# Patient Record
Sex: Female | Born: 1935 | Race: White | Hispanic: No | Marital: Married | Smoking: Never smoker
Health system: Southern US, Community
[De-identification: ages and names within clinical notes are randomized; demographics above are authoritative.]

## PROBLEM LIST (undated history)

## (undated) DIAGNOSIS — E785 Hyperlipidemia, unspecified: Secondary | ICD-10-CM

## (undated) DIAGNOSIS — I10 Essential (primary) hypertension: Secondary | ICD-10-CM

## (undated) DIAGNOSIS — I77810 Thoracic aortic ectasia: Secondary | ICD-10-CM

## (undated) DIAGNOSIS — E119 Type 2 diabetes mellitus without complications: Secondary | ICD-10-CM

## (undated) DIAGNOSIS — J189 Pneumonia, unspecified organism: Secondary | ICD-10-CM

## (undated) DIAGNOSIS — I350 Nonrheumatic aortic (valve) stenosis: Secondary | ICD-10-CM

## (undated) HISTORY — PX: CHOLECYSTECTOMY: SHX55

## (undated) HISTORY — DX: Hyperlipidemia, unspecified: E78.5

## (undated) HISTORY — PX: APPENDECTOMY: SHX54

## (undated) HISTORY — PX: TONSILLECTOMY: SUR1361

## (undated) HISTORY — PX: CATARACT EXTRACTION W/ INTRAOCULAR LENS  IMPLANT, BILATERAL: SHX1307

## (undated) HISTORY — DX: Essential (primary) hypertension: I10

## (undated) HISTORY — DX: Thoracic aortic ectasia: I77.810

## (undated) HISTORY — DX: Type 2 diabetes mellitus without complications: E11.9

## (undated) HISTORY — PX: KNEE ARTHROPLASTY: SHX992

## (undated) HISTORY — DX: Nonrheumatic aortic (valve) stenosis: I35.0

---

## 1999-02-10 ENCOUNTER — Encounter: Payer: Self-pay | Admitting: Cardiothoracic Surgery

## 1999-02-12 ENCOUNTER — Encounter (INDEPENDENT_AMBULATORY_CARE_PROVIDER_SITE_OTHER): Payer: Self-pay | Admitting: Specialist

## 1999-02-13 ENCOUNTER — Inpatient Hospital Stay (HOSPITAL_COMMUNITY): Admission: AD | Admit: 1999-02-13 | Discharge: 1999-02-20 | Payer: Self-pay | Admitting: Cardiology

## 1999-02-13 ENCOUNTER — Encounter: Payer: Self-pay | Admitting: Cardiothoracic Surgery

## 1999-02-13 HISTORY — PX: AORTIC VALVE REPLACEMENT: SHX41

## 1999-02-14 ENCOUNTER — Encounter: Payer: Self-pay | Admitting: Cardiothoracic Surgery

## 1999-02-16 ENCOUNTER — Encounter: Payer: Self-pay | Admitting: Cardiothoracic Surgery

## 2001-09-29 ENCOUNTER — Ambulatory Visit (HOSPITAL_COMMUNITY): Admission: RE | Admit: 2001-09-29 | Discharge: 2001-09-29 | Payer: Self-pay | Admitting: *Deleted

## 2004-03-05 ENCOUNTER — Encounter: Payer: Self-pay | Admitting: Orthopedic Surgery

## 2004-03-09 ENCOUNTER — Encounter: Payer: Self-pay | Admitting: Orthopedic Surgery

## 2004-04-09 ENCOUNTER — Encounter: Payer: Self-pay | Admitting: Orthopedic Surgery

## 2004-05-07 ENCOUNTER — Encounter: Payer: Self-pay | Admitting: Orthopedic Surgery

## 2006-05-31 ENCOUNTER — Ambulatory Visit: Payer: Self-pay | Admitting: Otolaryngology

## 2007-12-23 ENCOUNTER — Ambulatory Visit: Payer: Self-pay | Admitting: Cardiology

## 2007-12-23 ENCOUNTER — Encounter (INDEPENDENT_AMBULATORY_CARE_PROVIDER_SITE_OTHER): Payer: Self-pay | Admitting: Family Medicine

## 2007-12-23 ENCOUNTER — Inpatient Hospital Stay (HOSPITAL_COMMUNITY): Admission: EM | Admit: 2007-12-23 | Discharge: 2007-12-24 | Payer: Self-pay | Admitting: Emergency Medicine

## 2009-10-02 ENCOUNTER — Ambulatory Visit: Payer: Self-pay | Admitting: Cardiothoracic Surgery

## 2010-04-02 ENCOUNTER — Ambulatory Visit
Admission: RE | Admit: 2010-04-02 | Discharge: 2010-04-02 | Payer: Self-pay | Source: Home / Self Care | Attending: Cardiothoracic Surgery | Admitting: Cardiothoracic Surgery

## 2010-04-02 ENCOUNTER — Encounter
Admission: RE | Admit: 2010-04-02 | Discharge: 2010-04-02 | Payer: Self-pay | Source: Home / Self Care | Attending: Cardiothoracic Surgery | Admitting: Cardiothoracic Surgery

## 2010-04-03 NOTE — Assessment & Plan Note (Signed)
OFFICE VISIT  [name], [name] DOB:  Aug 09, 1926                                        April 02, 2010 CHART #:  [id number]  CURRENT PROBLEMS: 1. A 4.5-cm moderate dilatation of the ascending aorta - fusiform     aneurysm. 2. Status post aortic valve replacement in December 2000 with a     bioprosthetic (bovine) valve for aortic stenosis. 3. Hypertension.  CURRENT MEDICATIONS: 1. Nortriptyline 10 mg nightly. 2. Benazepril 5 mg daily. 3. Losartan/HCTZ 100/25 mg daily. 4. Aspirin 81 mg daily. 5. Prandin 0.5 mg p.o. b.i.d. 6. Metformin 1/2 tablet daily (500 mg).  PRESENT ILLNESS:  The patient returns for a 47-month followup with a CT scan of the chest to follow dilatation of the ascending aorta status post prior aortic valve replacement.  Her most recent echo done at [place] indicated evidence of a well-seated valve without aortic insufficiency with a peak gradient measured at 50 mmHg which is probably artifactual.  A 21-mm valve was placed for a bicuspid valve.  The patient denies any symptoms of CHF, orthopnea, angina, or significant change in her exercise tolerance.  She denies pedal or abdominal edema.  She lives alone and remains fairly active.  She is a TEFL teacher Witness and would not consent to any surgery that would require blood transfusion therapy.  PHYSICAL EXAMINATION:  Vital Signs:  Blood pressure 150/80, pulse 77 and regular, and saturation 95% on room air.  Weight 147 pounds.  General: She is alert and very pleasant.  Lungs:  Breath sounds are clear and equal.  The sternum is well healed.  Cardiac:  Rhythm is regular.  She has a soft flow murmur through the aortic prosthesis and there is no evidence of diastolic murmur.  Abdomen:  Soft.  Extremities:  She has no pedal edema.  DIAGNOSTIC TESTS:  His CT scan today as compared to the last CT scan 6 months ago at [facility] shows no change in raising aorta.  The  diameter measures approximately 4.3 cm by the CT scan here at [facility].  There is no penetrating ulcer or false lumen.  The descending thoracic aorta is of normal caliber.  IMPRESSION AND PLAN:  The patient has moderate dilatation of her ascending aorta probably related to her bicuspid aortic valve disease and the associated abnormalities in the aortic root, connective tissue structure.  Blood pressure control with a beta-blocker would be the best therapy for this elderly lady who would be at very high risk for redo heart surgery, especially under the circumstances of her wishing to avoid any blood products.  I would recommend continuing her medical therapy and I plan on following her with annual CT scan without contrast in order to avoid the dye load or any effect on her renal function.  She understands this plan and agrees with that and I will see her back in 1 year unless new problems occur.  Thank you very much for the opportunity to see this nice patient.  [name] [name], M.D. Electronically Signed  PV/MEDQ  D:  04/02/2010  T:  04/03/2010  Job:  [id number]  cc:   [name] Buffy, MD

## 2010-07-22 NOTE — Discharge Summary (Signed)
NAME:  [name], [name]                  ACCOUNT NO.:  [id number]   MEDICAL RECORD NO.:  [id number]          PATIENT TYPE:  INP   LOCATION:  A310                          FACILITY:  [facility]   PHYSICIAN:  Dorris Singh, DO    DATE OF BIRTH:  07/31/1926   DATE OF ADMISSION:  12/23/2007  DATE OF DISCHARGE:  10/17/2009LH                               DISCHARGE SUMMARY   ADMISSION DIAGNOSIS:  1. Chest pain.   DISCHARGE DIAGNOSIS:  1. Gastroesophageal reflux disease.  2. Chest pain resolved and history of coronary artery disease.  3. Renal insufficiency.  4. Diastolic dysfunction.   Tests that were done include a portable chest x-ray one view which  showed no acute abnormality.  Also she had an echo done on the 16th  which demonstrated left ventricular with overall ventricular function is  between 70-75%.  There is no diagnostic evidence of any ventricular wall  motion abnormalities.  Left ventricular wall thickness was moderately  increased which is consistent with mild diastolic dysfunction.  Ascending aortic dimension was 42 mm.  There is mild atrial annular  calcification.  There is mild mitral valvular regurgitation, the  effective  mitral regurgitation by proximal isovelocity of surface area  was .32 cm squared with a volume of mitral regurgitation  proximal  isovelocity was 26 cc.  Left atrium was mildly dilated and estimated  peak pulmonary artery systolic pressure was mildly improved.   HOSPITAL COURSE:  She was admitted with the above diagnosis.  [facility] was called to see her.  The patient actually has a  cardiologist in [place] with whom she has been following for several  years.  [place] [facility] read her echo and determined that if her  enzymes remained normal for today she could be discharged to home with  followup with her cardiologist in 1 to 2 weeks.   On evaluation today the patient was doing well.  She had no episodes of  chest pain.  Her vitals are  within normal limits with a blood pressure  of 109/59.  Heart is regular rate and rhythm.  Mild crepitations  bilaterally.  Abdomen soft, nontender, not distended.  Extremities  positive pulses.  Lungs clear auscultation bilaterally.   [place] count of 5.5, hemoglobin 1.7, hematocrit 34.0, platelet count of  150.  Her glucose was 140, sodium 139, potassium 3.8, chloride 99, C02  27, glucose 138, BUN 23, creatinine 1.52.   Cardiac markers for this morning:  Her creatinine kinase was 331.  CK-MB  was 3.2 and Troponin's were  0.02 and her BNP was 111 which is slightly  elevated.   PLAN AT DISCHARGE:  1. She will  be placed on Protonix.  A new prescription 1 p.o. q. day.      Also at this point in time with an elevated BNP we will have her      followup with her primary care doctor and/or her physician to check      this and to follow it to see if she will need to be placed on Lasix  but right now I think she can be followed and her cardiologist can      make that determination.  She will be sent home on her current      medications with include Atacand 4 mg p.o. daily, atenolol 50 mg      p.o. daily, Doxycycline 100 mg p.o. b.i.d., hydrocortisone 2.5%      twice daily, Metformin 500 mg once a day, Prandin 0.5 mg b.i.d.,      Tramadol hydrochlorothiazide 75/50 once a day, Tricor 48 mg once a      day, acetic acid otic one drop twice a day, Naftin topical 0.1      twice a day, __________ 0.2% one drop twice a day.   INSTRUCTIONS:  1. Increase her activities slowly.  2. Eat a heart healthy diet.  3. Followup with her primary care physician Dr. Isabella Bowens  in 1 to 2 weeks      and for her cardiologist as recommended within the next 5 to 7 days      or sooner if symptoms occur.  4. She is to return if she has any symptoms.   Patient seems to have an understanding of the current plan. <30 minutes  on dc summary      Dorris Singh, DO  Electronically Signed     CB/MEDQ  D:   12/24/2007  T:  12/24/2007  Job:  [id number]

## 2010-07-22 NOTE — H&P (Signed)
NAME:  [name] [name], [name] [name]                  ACCOUNT NO.:  [id number]   MEDICAL RECORD NO.:  [id number]          PATIENT TYPE:  INP   LOCATION:  A310                          FACILITY:  [facility]   PHYSICIAN:  Dorris Singh, DO    DATE OF BIRTH:  01-13-1927   DATE OF ADMISSION:  12/23/2007  DATE OF DISCHARGE:  LH                              HISTORY & PHYSICAL   Patient is a 77 year old female who presented with a chief complaint of  chest discomfort.  She apparently went to her primary care physician  today.  She is actually bringing her husband in for evaluation, when she  was telling the nurse that she was having some neck pain.  She describes  it as having it for about five days.  She also states that the onset was  acute and that she feels it more in her throat, like her throat feels  raw.  She states that it is located in the bilateral chest, particularly  in her left side.  Nothing made it better, nothing made it worse.   PAST MEDICAL HISTORY:  1. Coronary artery disease.  2. Hypertension.  3. Diabetes.  4. Rhinitis.  5. Rosacea.  6. Otitis externa.   FAMILY HISTORY:  Significant for CAD.  She has had a CABG.   She is a nonsmoker, nondrinker.   She has a LATEX allergy.   ALLERGIES:  SULFA, LATEX, as mentioned.   CURRENT MEDICATIONS:  1. Atacand 4 mg p.o. daily.  2. Atenolol 50 mg p.o. daily.  3. Doxycycline 100 mg twice daily.  4. Hydrocortisone 2.5 twice daily.  5. Metformin 500 mg once daily.  6. Prandin 0.5 mg b.i.d.  7. Tramadol/hydrochlorothiazide 75/50 once daily.  8. Tricor 48 mg once daily.  9. Acetic acid otic 1 drop twice daily.  10.Naftin topical 0.1% twice daily.  11.Pataday ophthalmic 0.2% 1 drop twice daily.   REVIEW OF SYSTEMS:  CONSTITUTIONAL:  Negative.  HEENT:  Negative.  HEART:  Positive chest pain.  PULMONARY:  Negative.  GI:  Possible  dyspepsia.  GU:  Negative.  MUSCULOSKELETAL:  Negative.   PHYSICAL EXAMINATION:  VITAL SIGNS:  Blood pressure  119/62, pulse rate  62, respirations 20, temperature 97.6.  GENERAL:  Patient is a 77 year old Caucasian female who is well-  developed and well-nourished in no acute distress.  HEAD:  Normocephalic and atraumatic.  EYES:  EOMI, PERRLA.  ENT:  Normal.  MOUTH/PHARYNX:  No erythema or exudate noted.  NECK:  Supple.  No lymphadenopathy.  Full range of motion.  CARDIOVASCULAR:  Regular rate and rhythm.  No murmurs, rubs or gallops.  CHEST:  Movements are symmetrical.  Positive CABG scar.  RESPIRATORY:  Clear to auscultation bilaterally.  No wheezes, rales or  rhonchi.  ABDOMEN:  Soft, nontender, nondistended.  No organomegaly noted.  Bowel  sounds in all four quadrants.  EXTREMITIES:  Full range of motion.  No tenderness.  No edema.  Positive  right surgical scar on the knee.  NEURO:  Cranial nerves II-XII are grossly intact.  Patient is AO x3.  Her EKG shows first-degree AV block.  She is in sinus rhythm.  There is  no EKG for comparison.   Her BNP is 110.  Sodium 137, potassium 3.7, chloride 101, carbon dioxide  29, glucose 76, BUN 27, creatinine 1.27.  [place] count 6.9, hemoglobin  12.6, hematocrit 36.6, platelet count 168.  First set of cardiac markers  is negative.  The second set of cardiac markers is negative.   Her chest x-ray shows no acute abnormality.   Patient was admitted for chest pain:  Will go ahead and admit her to the  telemetry floor and continue to monitor her.  Will put her on bedrest.  Also, will consult [facility] and see if we need to repeat the  echo today.  Patient is complaining of some dyspepsia symptoms as well.  We will do Protonix p.o. q.12h. and do DVT and GI prophylaxis as well.  Will continue to monitor her.  If everything is within normal limits, we  will plan on discharging her.      Dorris Singh, DO  Electronically Signed     CB/MEDQ  D:  12/23/2007  T:  12/23/2007  Job:  [id number]

## 2010-07-22 NOTE — Consult Note (Signed)
NEW PATIENT CONSULTATION   [name] [name], [name] [name]  DOB:  1927/01/22                                        October 02, 2009  CHART #:  [id number]   REASON FOR CONSULTATION:  1. A 4.7 cm moderate dilatation of the ascending aorta - fusiform      aneurysm.  2. Status post aortic valve replacement in December 2000 with a      bioprosthetic valve for aortic stenosis.  3. Hypertension.   HISTORY OF PRESENT ILLNESS:  I was asked to evaluate this 77 year old  Caucasian hypertensive female for treatment of recently diagnosed  fusiform aneurysm, measuring 4.7 cm of the ascending thoracic aorta.  The patient underwent an aortic valve replacement by myself at [facility], December 2010, for severe aortic stenosis with 21-mm  pericardial valve.  The patient has been carefully followed by Dr.  Kelton Pillar with annual 2-D echoes.  The aortic valve continues to function  appropriately with a mild gradient and without aortic insufficiency.  LV  systolic function is well preserved and LVH with diastolic dysfunction  is moderate.  The patient on her most recent echo was noted to have an  enlarged ascending aorta and a followup CT angiogram was performed at  [facility] in [place].  This demonstrates a fusiform  ascending aneurysm, measuring 4.7 cm with some mild dilatation of the  transverse aortic arch and a normal descending thoracic aortic diameter  of 2.5 cm.  There is no evidence of dissection, intramural hematoma, or  penetrating ulcer.  There are no suspicious pulmonary nodules or pleural  effusion noted.  The patient has been asymptomatic.  She presents here  for evaluation of this radiologic finding.  Specifically, she has had no  chest or back pain.  The patient is still very functional, living alone,  taking care of her home.  Since her aortic valve replacement 10 years  ago, the only operation or medical problem she has had has been with  bilateral total knee  replacement, which have been without complication.  She does have a long history of hypertension and now is taking multiple  medications including losartan 50 mg a day, HCTZ 12.5 mg daily, and  benazepril 5 mg nightly.  She had been tried on beta-blocker in the past  through Dr. Angela Nevin office, however, this was stopped due to  intolerance due to fatigue and lethargy.  She also has apparently had  been tried on a statin in the past, but that was stopped due to leg  cramps and she currently is taking TriCor.  For her bioprosthetic valve,  she takes aspirin 81 mg daily.  She has maintained a sinus rhythm.   PAST MEDICAL HISTORY:  1. Hypertension.  2. Status post AVR in December 2000.  3. Non-insulin diabetes.  4. Jehovah Witness, refusing blood products.   HOME MEDICATIONS:  1. Multivitamin 1 daily.  2. Metformin 500 mg 1/2 tablet daily.  3. HCTZ 25 mg daily.  4. TriCor 145 mg daily.  5. Prandin 0.5 mg b.i.d.  6. Aspirin 81 mg daily.  7. Fish oil 1 g daily.  8. Folic acid 1 tablet daily.  9. Losartan 50 mg daily.  10.Nortriptyline 10 mg nightly.   ALLERGIES:  Sulfa.   SOCIAL HISTORY:  The patient lives alone.  Her husband is disabled  and  lives in nursing home.  She has adult children.  She is a never smoker  and does not drink alcohol.   REVIEW OF SYSTEMS:  CONSTITUTIONAL:  Negative for fever, weight loss, or  night sweats.  ENT:  Negative for active dental problems or difficulty swallowing.  THORACIC:  Negative for history of prior thoracic trauma.  She is status  post aortic valve replacement through sternotomy.  She tolerated that  operation well despite refusing any blood product transfusion due to her  religious beliefs.  She has no history of hepatitis, jaundice, or blood  per rectum.  She did recently have a viral gastroenteritis, which has  resolved.  Apparently, she had a CT scan at the local hospital, which  was interpreted as negative.  She has diabetes, but no  thyroid disease.  She denies DVT, claudication, TIA, pulmonary emboli, or diabetic  neuropathy.  She denies bleeding disorder or prior blood transfusions.  She denies stroke, mini stroke, or seizure.  Her family states she has  had some short-term memory loss.  She remains quite functional, however.   PHYSICAL EXAMINATION:  Vital Signs:  She is 5 feet 4 inches and weighs  150 pounds.  Blood pressure 140/80, pulse 80 and regular, respirations  18, and saturation on room air 97%.  She is afebrile.  General:  General  appearance is that of a very pleasant elderly Caucasian female  accompanied by her adult children.  No acute distress.  HEENT:  Normocephalic.  Dentition good.  Neck:  Without JVD, mass, or carotid  bruit.  Lymphatics:  No palpable cervical or supraclavicular adenopathy.  Lungs:  Breath sounds are clear and equal.  Chest:  No deformity.  She  has a well-healed sternal incision.  She has a soft systolic flow murmur  through the bioprosthetic valve without diastolic murmur.  There is no  S3 gallop.  Abdomen:  Abdomen:  Soft without pulsatile mass.  Extremities:  No clubbing, cyanosis, or edema.  Peripheral pulses are  intact.  Neurologic:  Nonfocal.  No motor deficit.   LABORATORY DATA:  I reviewed the CT scans from the outside hospital as  well as the 2-D echo reports.  She has LVH with normal systolic ejection  fraction of 60%.  The velocity to the prosthetic valve is 3 m/sec.  Compared to the echo of June 2010, there is some enlargement of the  aorta noted.  The CT angiogram is reviewed and she has a fusiform  dilatation of the ascending aorta, measuring maximum of 4.7 cm at the  level of the main pulmonary artery.  The arch is minimally dilated and  the descending thoracic aorta is normal.  There is no evidence of  dissection, intramural hematoma, or penetrating ulcer.   IMPRESSION AND PLAN:  I discussed the situation in detail with the  patient and her family.  At the  time of surgery 11 years ago, her  ascending aorta was noted to be mildly dilated at 3.5-4 cm.  It is now  4.7 cm.  She has underlying hypertension and cannot tolerate a beta-  blocker or a statin, but is tolerating other blood pressure medications  and antilipid medications.  She is fortunately in sinus rhythm and does  not require Coumadin.   The guidelines for treatment of a fusiform ascending aneurysm is to  recommend surgery at a diameter of approximately 5.5 cm unless there is  a medical history of Marfan disease or family history of  aneurysm  disease, at which time, the threshold is 5 cm.  At her age, I believe  she has developed gradual dilatation of the aortic wall with her  hypertension and may have had some poststenotic dilatation from her  previous bicuspid valve aortic stenosis.   She is at low risk for dissection with a measurement of 4.7 cm.  I would  recommend serial CT scans to follow the ascending aorta and would  consider surgery only if the aorta showed rapid progression in size from  one CT scan to the next or if the aorta became in excess of 5.5 cm.  At  that point, a serious discussion would be needed to assess risk of  surgery if the patient still believes she would not want any blood  products under any circumstance, then the benefit of surgery versus  medical therapy and would be reassessed.   For the present time, I plan on seeing her back in 6 months with a CT  angiogram and we will follow her thoracic aortic disease for now.   Kerin Perna, M.D.  Electronically Signed   PV/MEDQ  D:  10/02/2009  T:  10/03/2009  Job:  [id number]   cc:   Reynolds Bowl, MD  L. Alvis Lemmings, MD

## 2010-07-25 NOTE — Discharge Summary (Signed)
[place]. [facility]  Patient:    [name] [name]                          MRN: [id number] Adm. Date:  [id number] Disc. Date: 02/20/99 Attending:  Mikey Bussing Dictator:   Loura Pardon, P.A. CC:         Yehuda Savannah, M.D., [place], [place].             Lacretia Leigh. Molly Maduro, M.D.             Thereasa Solo. Little, M.D.                           Discharge Summary  DATE OF BIRTH:  12-16-1926  PRIMARY CARE FAMILY DOCTOR:  Dr. Yehuda Savannah, [place], [place].  CARDIOLOGIST:  Dr. Tinnie Gens C. [place], [place], [place] and Dr. Clarene Duke.  FINAL DIAGNOSES:  1. Accelerating fatigue/dyspnea Class III angina.  2. Severe calcific aortic stenosis.  3. Postoperative anemia.  4. Postoperative thrombocytopenia.  SECONDARY DIAGNOSES:  1. Hypertension  2. Arthritis.  3. Type 2 diabetes mellitus  4. Status post tonsillectomy, cholecystectomy, appendectomy.  5. Strong family history of atherosclerotic vascular disease.  PROCEDURE:  1. February 12, 1999, right and left heart catheterization Dr. Clarene Duke. This study     showed that the aortic root was slightly dilated.  No aortic insufficiency.     Cardiac output of 3.6, cardiac index of 1.96.  There was dense calcific aortic     valve, a normal left ventricular function. The right coronary artery had a 0%     proximal irregular plaque.  Left main was free of disease.  Left anterior     descending coronary artery had a 20-30% stenosis before the first diagonal. The     left circumflex was codominant and free of disease.  2. Aortic valve replacement with 21 mm pericardial tissue valve - Dr. Kathlee Nations     Trigt.  The patient tolerated the procedure well and was transferred in a     stable and satisfactory condition to the recovery room.  DISCHARGE DISPOSITION:  Mrs. [name] [name] is judged suitable for discharge on postoperative day #7.  She was relieved of all supplemental oxygen. By postoperative day #5 she  was ambulating independently.  She has return of appetite with full gastrointestinal tract function.  She has been afebrile in the postoperative period.  She has experienced no cardiac dysrhythmias just slight episode of supraventricular tachycardia now on digoxin and Lopressor.  Her wounds are healing nicely.  There is no evidence of erythema or drainage.  DISCHARGE MEDICATIONS:  She goes home on the following medications:  1. Darvocet-N 100 1-2 tablets p.o. q.3-4h. p.r.n. pain.  2. Atenolol 25 mg 1/2 tablet in the morning and 1/2 tablet in the evening.  3. Digoxin 0.25 mg daily.  4. Folic acid 2 mg daily.  5. Multivitamin with zinc daily.  6. Glucotrol 5 mg in the morning daily.  7. Lasix 40 mg b.i.d. for 4 days and then to start Lasix 40 mg daily February 14, 1999.  8. Potassium chloride 20 mEq b.i.d. for 4 days and then to start potassium     chloride 20 mEq daily on February 24, 1999.  9. Niferex 150 mg daily. 10. Enteric-coated aspirin 325 mg daily.  DISCHARGE ACTIVITY:  Ambulating as tolerated. She is  asked not to lift more than 10 pounds nor to drive for the next 6 weeks.  DISCHARGE DIET:  Low sodium, low cholesterol, ADA diet.  WOUND CARE:  She may bathe daily keeping her incision clean and dry.  FOLLOWUP  She will have an office visit with Dr. Irish Lack.  She is asked to call his office to arrange the appointment for 2 weeks after discharge. They will take an x-ray at that visit.  She will also have an office visit with Dr. Kathlee Nations Trigt, Friday, March 14, 1999, at 10:15 in the morning.  She is asked to bring the chest x-ray to that visit.  BRIEF HISTORY:  Ms. [name] [name] is a 77 year old female with a history of hypertension.  She presents with chronic tiredness with left arm and bilateral lower extremity weakness.  She was referred by her primary care doctor, Dr. Yehuda Savannah and by her cardiologist, Dr. Irish Lack for evaluation of  aortic stenosis.  She has had a known murmur of aortic stenosis for several years and as been followed by serial echocardiograms since the 1990s.  She has been getting  little more tired lately and can work around the house for approximately 2 hours before shortness of breath and associated chest pressure causes her to discontinue activity.  She denies any history of nausea, vomiting, diaphoresis, or impaired  speech.  Recent echocardiogram on December 24, 1998, showed normal left ventricular function and severe aortic stenosis. The estimated ejection fraction is 65-75%.  She is scheduled to have a left heart catheterization on arrival at [facility] January 13, 1999.  HOSPITAL COURSE:  Ms. Audi Conover presented to [facility] on February 12, 1999, for a left heart catheterization.  The study showed a severe calcific aortic stenosis.  The surgeons of cardiovascular thoracic surgery of [facility] were contacted.  Dr. Kathlee Nations Trigt saw Mrs. [name] [name] in consultation nd recommended replacement of her aortic valve.  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  On February 13, 1999, she underwent aortic valve replacement with placement of a 21 mm pericardial tissue valve.  She was extubated on the day of surgery.  Her postoperative cardiac index was 3.4, hematocrit was 23.  On postoperative day #1 her platelets were 69,000, hematocrit 22.6%.  She had been given Epogen 10,000 units subcu daily and will continue to receive that for the  next 6 days of her postoperative course.  Her postoperative creatinine was 1.0. She was also started on Iron supplementation when she was able to oral medications.  On postoperative day #2 her hematocrit had dropped to 10.5%, platelets were 56,000.  On postoperative day #3 she was achieving 96% oxygen saturation with 2  liters of nasal cannula and her aspirin was on hold secondary to thrombocytopenia.  She was  fluid positive and a more strenuous diuresis was begun.  The nadir of her hemoglobin on postoperative day #3 when it was hemoglobin 6.6,  hematocrit 19.5%, platelets 56,000.  On postoperative day #4 her weight was 169, 6 pounds over her preoperative weight. Diuresis was continued.  She was achieved 96% oxygen saturation on 1 liter of nasal cannula. She was ambulating independently. She did have an accelerated sinus rhythm with frequent PACs.  She was started on digoxin for [facility] protocol.  On postoperative day #5 her weight was decreased to 167.  Her hemoglobin  was 7,  hematocrit 20.6%.  Platelets had risen to 147,000.  She was restarted on her aspirin.  In addition she was started on Lopressor 12.5 mg b.i.d. for sinus tachycardia and supraventricular tachycardic episodes.  On postoperative day #6 her hemoglobin was stable at 7.  Her magnesium was 2.1, she was ambulating independently.  Her incision was healing well.  She was taking oral nourishment and had full gastrointestinal track function.  She had remained afebrile postoperatively.  She was judged a suitable candidate for discharge on postoperative day #7. DD:  02/19/99 TD:  02/20/99 Job: [id number]

## 2010-07-25 NOTE — Procedures (Signed)
[place]. [facility]  Patient:    [name] [name]                          MRN: [id number] Proc. Date: 02/13/99 Adm. Date:  [id number] Attending:  Mikey Bussing                           Procedure Report  DIAGNOSIS:  Aortic stenosis.  PROCEDURE:  Transesophageal echocardiogram.  ANESTHESIOLOGIST:  Halford Decamp, M.D.  INDICATIONS:  Ms. Gulick is a 77 year old [place] female who presented to the operating room for aortic valve replacement.  Dr. Donata Clay requested transesophageal echocardiogram for the intraoperative care of this patient.  DESCRIPTION OF PROCEDURE:  The patient underwent a routine cardiac induction. Follow intubation, the mouth guard was placed carefully into the patients mouth, and the transesophageal probe was lubricated and inserted into the patients stomach and withdrawn for cardiac imaging.  Overall impressions of the heart demonstrated no evidence of effusion and a normal size heart.  The right atrium had normal dimensions and no evidence of thrombosis. The intra-atrial septum was evaluated, and there was no evidence for intra-atrial septal defect.  The tricuspid valve demonstrated the pulmonary artery catheter, and there was trace regurgitation.  The overall structure of the valve appeared normal. The right ventricle had normal contractility and was normal in size.  The left atrium had no evidence of thrombus or masses.  The size was normal.  The mitral valve was then evaluated which was difficult to get good views of but showed only trace regurgitation with normal-appearing structures.  The left ventricle had hypertrophic ______ with good contractility overall.  There was a  small chamber.  The aortic valve was heavily calcified and had severe stenosis ith no evidence of regurgitation.  There was evidence of post stenotic dilation of he aorta.  Following successful replacement of the valve, the patient separated  from bypass without problems, and transesophageal echocardiogram demonstrated a normal functioning of the prosthetic valve following separation from bypass.  Overall image of the heart showed normal contractility of the left ventricle with good volume loading.  The patient tolerated the procedure well.  The transesophageal  probe was carefully removed following surgery, and the patient was taken to the  SICU. DD:  02/13/99 TD:  02/13/99 Job: [id number]

## 2010-07-25 NOTE — Cardiovascular Report (Signed)
[place]. [facility]  Patient:    BENNETT VANSCYOC                          MRN: [id number] Proc. Date: 02/12/99 Adm. Date:  [id number] Attending:  Mikey Bussing CC:         Mikey Bussing, M.D.                        Cardiac Catheterization  PROCEDURES: 1. Right heart catheterization. 2. Cardiac output by thermodilution. 3. Attempted left heart catheterization. 4. Coronary arteriography.  INDICATION FOR TESTS:  Mrs. Khamis is a 77 year old female who, by echocardiogram, has severe aortic stenosis with a 64 mm aortic valve gradient and an aortic valve area of 0.94.  She is markedly symptomatic.  This cath is done in preparation for a valve replacement.  DESCRIPTION OF THE PROCEDURE:  The patient was prepped and draped in the usual sterile fashion exposing the right groin.  Following local anesthetic with 1% Xylocaine, the Seldinger technique was employed a #6 [place] introduced sheath was placed in the right femoral artery and an #8 [place] introducer sheath in the right femoral vein.  A Swan-Ganz catheter was advanced through its normal route into the pulmonary artery.  Hemodynamic monitoring was undertaken throughout each station and cardiac output by thermodilution was performed.  Attempts at crossing the aortic valve ere unsuccessful and after 20 minutes using a variety of catheters including an angled pigtail, a right coronary catheter and a multipurpose catheter, attempts at crossing the valve were terminated and the valve was never successfully crossed, therefore ventriculography was not performed.  Following this selective right and left coronary arteriography was performed.  RESULTS: 1. HEMODYNAMIC MONITORING:  Central aortic pressure was 122/52.  Ventricular    pressure not recorded.  Cardiac output by thermodilution 3.6 l/min for a cardiac    index of 1.96.  Right atrial pressure 3.  Right ventricular pressure 26/3.  Pulmonary artery pressure 22/7.  The Swan-Ganz catheter would not wedge.     An aortic root injection revealed the root to be slightly dilated about    4-1/2 cm.  No aortic insufficiency was seen.  2. CORONARY ARTERIOGRAPHY:  There was dense calcification on fluoroscopy of the    aortic valve.   The left main was normal.  The LAD had a 20-30% lesion at the    bifurcation of the LAD and first diagonal with the distal vessels being    completely free of disease.  3. CIRCUMFLEX:  Normal.  This was codominant vessel.  4. RIGHT CORONARY ARTERY:  The right coronary artery had a 20% proximal    irregularity.  CONCLUSION: 1. Minimal coronary artery disease. 2. Normal right heart pressures. 3. Dense calcification of the aortic valve with inability to cross the aortic    valve. DD:  02/18/99 TD:  02/19/99 Job: [id number]

## 2010-07-25 NOTE — Procedures (Signed)
[place]. [facility]  Patient:    [name] [name], [name] [name] Visit Number: [id number] MRN: [id number]          Service Type: END Location: ENDO Attending Physician:  Mora Appl Dictated by:   Meade Maw, M.D. Proc. Date: 09/29/01 Admit Date:  09/29/2001 Discharge Date: 09/29/2001   CC:         Madilyn Hook, M.D.  Mikey Bussing, M.D.   Procedure Report  REFERRING PHYSICIAN:  Madilyn Hook, M.D., [facility].  INDICATIONS FOR PROCEDURE:  Evaluation of bioprosthetic aortic valve.  PROCEDURE:  After obtaining written informed consent, the patient was brought to the endoscopy lab in the post absorptive state.  Preoperative sedation was achieved using IV Versed, IV Fentanyl.  Topical anesthesia was achieved using viscous lidocaine and Cetacaine spray.  Following appropriate sedation, the OmniPlant probe was introduced using digital gradience without difficulty. Multiple views were obtained at the mid esophageal, basal and deep gastric view.  The ascending aorta was not well visualized.  The arch and descending aorta was well visualized.  Bubble study was performed.  FINDINGS:  There was a bioprosthetic aortic valve.  The mean gradient was noted to be 7.7 mmHg.  The repeat maximum gradient was noted to be 13.2 mmHg. The patient was noted to be in atrial fibrillation.  Therefore, a total of five beats were averaged.  The bowel was well positioned.  There was no aortic insufficiency.  The valve leaflets were mobile.  There was some difficulty in manipulating the view of the aortic valve, but it was adequately demonstrated.  There was mild diffuse thickening of the anterior and posterior leaflet.  There was trivial mitral regurgitation noted. The tricuspid valve was grossly normal.  The pulmonic valve was poorly visualized.  There was normal wall motion.  Ejection fraction was 60%.  There was mild concentric hypertrophy.  The  intra-atrial septum was intact.  There was a negative bubble study.  The right ventricle was grossly normal.  The ascending aorta was not well visualized.  The descending aorta and arch were within normal limits.  FINAL IMPRESSION: 1. Bioprosthetic aortic valve with a mean gradient of 7.7 mmHg.  The maximum    gradient is 13.2 mmHg. 2. Normal left ventricular dimension. 3. Mild concentric hypertrophy. 4. Preserved systolic function.  The patient tolerated the procedure well. Dictated by:   Meade Maw, M.D. Attending Physician:  Meade Maw A DD:  09/29/01 TD:  10/02/01 Job: [id number]

## 2010-07-25 NOTE — Op Note (Signed)
[place]. [facility]  Patient:    [name] [name]                          MRN: [id number] Proc. Date: 02/13/99 Adm. Date:  [id number] Attending:  Mikey Bussing CC:         CVTS Office                           Operative Report  PREOPERATIVE DIAGNOSIS:  Bicuspid aortic valve with severe aortic stenosis, class III congestive heart failure and class III angina.  POSTOPERATIVE DIAGNOSIS:  Bicuspid aortic valve with severe aortic stenosis, class III congestive heart failure and class III angina.  PROCEDURE:  Aortic valve replacement for severe aortic stenosis.  SURGEON:  Mikey Bussing, M.D.  ASSISTANT:  Mosetta Pigeon, P.A.-C.  ANESTHESIA:  General by Dr. Halford Decamp.  INDICATIONS:  The patient is a 77 year old [place] female, who presents with progressive dyspnea on exertion and decreasing exercise tolerance.  She also has had exertional chest pain and has known aortic stenosis, followed by serial 2-D  echocardiogram.  Her most recent echocardiogram last month indicated a transvalvular gradient of over 50 mmHg with a calculated valve area of less than 1 sq.cm.  She had overall normal left ventricular systolic function with left ventricular hypertrophy and she was referred for aortic valve replacement. Prior to the operation, the patient underwent cardiac catheterization by Dr. Caprice Kluver, which indicated no significant coronary artery disease.  The catheter could not  transverse the aortic valve due to the severe stenosis.  There is no evidence of aortic regurgitation and there was mild dilatation of the ascending thoracic aorta.  Prior to the operation, the patient was examined in her hospital room and the results of her cardiac catheterization and echocardiogram were reviewed with her. I also visited with the patient and her family in the office prior to cardiac catheterization.  We again reviewed the indications and expected  benefits of aortic valve replacement.  I discussed the details of the operation, including the placement of the surgical incision and the use of cardiopulmonary bypass.  I discussed the preferred option for choice of valve prosthesis and we both agreed that a tissue valve would be in the patients best interest.  I discussed the alternatives to surgery, as well as, risks of the operation, including the risk of MI, CVA, bleeding, infection, and death.  Due to the patients religious beliefs in accordance with the [facility] Witness doctrine, we agreed that no blood products  would be used.  We did agree that the cell-saver and albumin and Hespan volume expanders would be acceptable.  The patient understood the benefits and risks of surgery and agreed to proceed with the operation as planned under informed consent.  PROCEDURE:  The patient was brought to the operating room and placed supine on he operating room table, where general anesthesia was induced under invasive hemodynamic monitoring.  The chest, abdomen and legs were prepped with Betadine and draped as a sterile field.  A median sternotomy was performed and the pericardium was opened.  Heparin was administered and the ACT is documented as being therapeutic.  The purse-strings are placed in the ascending aorta and right atrium and the patient was cannulated and placed on cardiopulmonary bypass and cooled o 32 degrees.  Cardioplegia cannulae were placed for antegrade and retrograde delivery of cold-blood cardioplegia  and a left ventricular vent was placed via he right superior pulmonary vein.  The patient was cooled to 28 degrees and as the  aortic cross-clamp was applied, 700 cc of cold-blood cardioplegia was delivered to the aortic root with immediate cardioplegic arrest and septal temperature dropping to less than 12 degrees.  Topical iced saline slush was used to augment myocardial preservation and a pericardial  insulator pad was used to protect the left phrenic nerve.  A transverse aortotomy was performed and the valve was inspected.  It was bicuspid, heavily calcified and stenotic.  The valve was excised.  The annulus was irrigated with copious amounts of cold saline.  A 21 mm pericardial sizer fit the annulus. Subannular 2-0 pledgeted Ethibond sutures are then placed around the annulus numbering 14 sutures total.  They are then placed through the sewing ring of the valve and the valve was seated and the sutures were tied.  The valve was inspected and found to be well-seated and the coronary ostium were widely patent.  The aortotomy was closed in two layers using running 4-0 Prolene and the patient was rewarmed.  Prior to tying off the aortotomy closure, the patient was given a hot shot of warm-blood retrograde cardioplegia and air was deaired from the heart using the other usual maneuvers.  The cross-clamp was removed and the heart was cardioverted back to a regular rhythm.  The patient was rewarmed and reperfused.  Temporary pacing wires were applied. The aortotomy closure was hemostatic and when the patient reached 37 degrees, the lungs are expanded and the ventilator was turned on.  The heart was filled with volume and the patient was weaned from cardiopulmonary bypass without difficulty, without the pacemaker and without inotropes.  Protamine was administered and the cannulae were removed.  Mediastinum was irrigated with warm antibiotic irrigation.  The pericardium was loosely closed over the ascending aorta.  Two mediastinal chest  tubes were placed and brought out through separate incisions.  The sternum was closed with interrupted steel wire and the pectoralis fascia and subcutaneous layers closed with running Vicryl.  The skin was closed with a subcuticular and  sterile dressings were applied.  Total cardiopulmonary bypass time was 100 minutes with an aortic cross-clamp  time of 60 minutes.  The patient returned to ICU in stable condition.  No blood products were administered to the patient during the operation. DD:  02/13/99 TD:  02/14/99 Job: [id number]

## 2010-12-09 LAB — CARDIAC PANEL(CRET KIN+CKTOT+MB+TROPI)
CK, MB: 2.5
CK, MB: 3.2
Relative Index: 1
Relative Index: INVALID
Total CK: 101
Total CK: 331 — ABNORMAL HIGH
Troponin I: 0.01
Troponin I: 0.01
Troponin I: 0.02

## 2010-12-09 LAB — B-NATRIURETIC PEPTIDE (CONVERTED LAB)
Pro B Natriuretic peptide (BNP): 110 — ABNORMAL HIGH
Pro B Natriuretic peptide (BNP): 111 — ABNORMAL HIGH

## 2010-12-09 LAB — DIFFERENTIAL
Basophils Relative: 1
Eosinophils Absolute: 0.2
Eosinophils Absolute: 0.2
Eosinophils Relative: 3
Lymphs Abs: 1.1
Lymphs Abs: 1.2
Monocytes Absolute: 0.7
Monocytes Relative: 11
Monocytes Relative: 12
Neutrophils Relative %: 64
Neutrophils Relative %: 68

## 2010-12-09 LAB — GLUCOSE, CAPILLARY
Glucose-Capillary: 140 — ABNORMAL HIGH
Glucose-Capillary: 159 — ABNORMAL HIGH
Glucose-Capillary: 70

## 2010-12-09 LAB — BASIC METABOLIC PANEL
BUN: 23
BUN: 27 — ABNORMAL HIGH
CO2: 27
CO2: 29
Calcium: 9.2
Chloride: 101
Chloride: 99
Creatinine, Ser: 1.27 — ABNORMAL HIGH
Creatinine, Ser: 1.52 — ABNORMAL HIGH
Glucose, Bld: 138 — ABNORMAL HIGH
Potassium: 3.7

## 2010-12-09 LAB — CBC
HCT: 34 — ABNORMAL LOW
HCT: 36.6
Hemoglobin: 11.7 — ABNORMAL LOW
MCHC: 34.4
MCHC: 34.4
MCV: 82.6
MCV: 83.3
Platelets: 150
Platelets: 168
RBC: 4.08
RDW: 14.1
WBC: 5.5
WBC: 6.9

## 2010-12-09 LAB — POCT CARDIAC MARKERS
CKMB, poc: 1.7
Myoglobin, poc: 117
Myoglobin, poc: 161

## 2010-12-09 LAB — PROTIME-INR
INR: 1.1
Prothrombin Time: 14.5

## 2010-12-09 LAB — APTT: aPTT: 35

## 2011-02-16 ENCOUNTER — Other Ambulatory Visit: Payer: Self-pay | Admitting: Cardiothoracic Surgery

## 2011-02-16 DIAGNOSIS — I712 Thoracic aortic aneurysm, without rupture: Secondary | ICD-10-CM

## 2011-03-25 ENCOUNTER — Ambulatory Visit (INDEPENDENT_AMBULATORY_CARE_PROVIDER_SITE_OTHER): Payer: Medicare Other | Admitting: Cardiothoracic Surgery

## 2011-03-25 ENCOUNTER — Ambulatory Visit
Admission: RE | Admit: 2011-03-25 | Discharge: 2011-03-25 | Disposition: A | Discharge status: Home / Self Care | Payer: Medicare Other | Source: Ambulatory Visit | Attending: Cardiothoracic Surgery | Admitting: Cardiothoracic Surgery

## 2011-03-25 VITALS — BP 136/82 | HR 96 | Resp 16 | Ht 63.0 in | Wt 153.0 lb

## 2011-03-25 DIAGNOSIS — I712 Thoracic aortic aneurysm, without rupture: Secondary | ICD-10-CM

## 2011-03-25 DIAGNOSIS — Z954 Presence of other heart-valve replacement: Secondary | ICD-10-CM

## 2011-03-25 DIAGNOSIS — Z952 Presence of prosthetic heart valve: Secondary | ICD-10-CM

## 2011-03-25 NOTE — Progress Notes (Signed)
Patient ID: TWILLA KHOURI, female   DOB: [birth year], 77 y.o.   MRN: [id number]   Current problems #1  4.5 cm moderate dilatation of the ascending aorta-fusiform aneurysm stable since July 2011 #2  status post aortic valve replacement December 2000 with a 21 mm pericardial tissue valve for bicuspid aortic stenosis #3  [employer] aversion to blood transfusion #4   hypertension controlled on meds   Present illness we'll [name] [name] is very nice 78 year old female who returns for annual CT scan followup of a fusiform ascending aneurysm of the thoracic aorta measuring 4.5 cm. It is asymptomatic. She is followed carefully by her cardiologist Dr.Kindman rocks [place]. The patient had a 2-D echo performed 6 months ago showing the prosthetic valve to be functioning normally. She denies chest pain or shortness of breath.  Medications include Prandin nortriptyline losartan and metformin.  Exam blood pressure 136/82 pulse 90 and regular saturation room air 95% weight 153 pounds height 5 foot 3 inches General appearance a pleasant elderly Caucasian female no acute distress HEENT exam normal cephalic no JVD good carotid pulses Thorax well-healed sternal incision clear breath sounds bilateral Cardiac regular rhythm soft flow murmur through the prosthetic aortic valve, no diastolic murmur Extremities without edema Neurologic intact slow but normal gait   Laboratory data CT scan of the chest with contrast shows no change in the dimer of the ascending aorta remaining at 4.5 cm. No new pathologic changes. Clear lung fields.  Impression/ plan      mild ascending fusiform aneurysm stable since July 2011. Her blood pressure is well-controlled. She has been previously tried on a beta blocker but she was unable to tolerate due to excessive fatigue.                                  Continue annual CTA of the chest or followup of the ascending fusiform aneurysm she will return in one year.

## 2012-03-03 ENCOUNTER — Other Ambulatory Visit: Payer: Self-pay | Admitting: *Deleted

## 2012-03-03 DIAGNOSIS — I712 Thoracic aortic aneurysm, without rupture: Secondary | ICD-10-CM

## 2012-03-30 ENCOUNTER — Ambulatory Visit (INDEPENDENT_AMBULATORY_CARE_PROVIDER_SITE_OTHER): Payer: Medicare Other | Admitting: Cardiothoracic Surgery

## 2012-03-30 ENCOUNTER — Encounter: Payer: Self-pay | Admitting: Cardiothoracic Surgery

## 2012-03-30 ENCOUNTER — Ambulatory Visit
Admission: RE | Admit: 2012-03-30 | Discharge: 2012-03-30 | Disposition: A | Discharge status: Home / Self Care | Payer: Medicare Other | Source: Ambulatory Visit | Attending: Cardiothoracic Surgery | Admitting: Cardiothoracic Surgery

## 2012-03-30 VITALS — BP 138/65 | HR 90 | Resp 20 | Ht 63.0 in | Wt 153.0 lb

## 2012-03-30 DIAGNOSIS — Z954 Presence of other heart-valve replacement: Secondary | ICD-10-CM

## 2012-03-30 DIAGNOSIS — Z952 Presence of prosthetic heart valve: Secondary | ICD-10-CM

## 2012-03-30 DIAGNOSIS — I712 Thoracic aortic aneurysm, without rupture: Secondary | ICD-10-CM

## 2012-03-30 DIAGNOSIS — E119 Type 2 diabetes mellitus without complications: Secondary | ICD-10-CM | POA: Insufficient documentation

## 2012-03-30 DIAGNOSIS — I350 Nonrheumatic aortic (valve) stenosis: Secondary | ICD-10-CM | POA: Insufficient documentation

## 2012-03-30 DIAGNOSIS — E785 Hyperlipidemia, unspecified: Secondary | ICD-10-CM

## 2012-03-30 DIAGNOSIS — I77810 Thoracic aortic ectasia: Secondary | ICD-10-CM | POA: Insufficient documentation

## 2012-03-30 DIAGNOSIS — I10 Essential (primary) hypertension: Secondary | ICD-10-CM | POA: Insufficient documentation

## 2012-03-30 LAB — CREATININE, SERUM: Creat: 1.4 mg/dL — ABNORMAL HIGH (ref 0.50–1.10)

## 2012-03-30 LAB — BUN: BUN: 26 mg/dL — ABNORMAL HIGH (ref 6–23)

## 2012-03-30 MED ORDER — IOHEXOL 350 MG/ML SOLN
80.0000 mL | Freq: Once | INTRAVENOUS | Status: AC | PRN
  Administered 2012-03-30: 80 mL via INTRAVENOUS

## 2012-03-30 NOTE — Progress Notes (Signed)
PCP is Reynolds Bowl, MD Referring Provider is Reynolds Bowl, MD  Chief Complaint  Patient presents with  . Thoracic Aortic Aneurysm    1 year f/u with CTA Chest, surveillance of ascending fusiform aneurysm    HPI: 79 year old woman status post aortic valve replacement for aortic stenosis with a 21 mm Edwards pericardial valve (model 2700) in 2000 doing well but found to have a mild/moderate enlargement of her ascending thoracic aorta 4.4 cm. She returns for one year followup with a CT angiogram repairs fairly against her last CTA with her ascending measurement stable at 4.4 cm. She had a echocardiogram at   [facility] [place], [place] [address] in July 2012 which demonstrated a peak gradient across the aortic valve of approximately 36 mmHg unchanged for the past several years. The patient is asymptomatic for aortic stenosis. Her blood pressure is well-controlled with oral medication. She is very satisfied with her functional level and she is living alone independently.  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   Past Medical History  Diagnosis Date  . Ascending aorta dilatation   . Hypertension   . Diabetes   . Aortic stenosis   . Dyslipidemia     Past Surgical History  Procedure Date  . Aortic valve replacement 02/13/1999    Dr. Sharene Butters Trigt    No family history on file.  Social History History  Substance Use Topics  . Smoking status: Never Smoker   . Smokeless tobacco: Never Used  . Alcohol Use: No    Current Outpatient Prescriptions  Medication Sig Dispense Refill  . aspirin 81 MG tablet Take 160 mg by mouth daily.      Marland Kitchen docusate sodium (COLACE) 100 MG capsule Take 100 mg by mouth 2 (two) times daily.      Marland Kitchen donepezil (ARICEPT) 10 MG tablet Take 10 mg by mouth 2 (two) times daily.      . fluticasone (FLONASE) 50 MCG/ACT nasal spray Place 2 sprays into the nose daily.        . folic acid (FOLVITE) 400 MCG tablet Take 400 mcg by mouth daily.      Marland Kitchen losartan-hydrochlorothiazide (HYZAAR) 100-25 MG per tablet Take 1 tablet by mouth daily.      . multivitamin (THERAGRAN) per tablet Take 1 tablet by mouth daily.      . nortriptyline (PAMELOR) 10 MG capsule Take 10 mg by mouth at bedtime.      . repaglinide (PRANDIN) 0.5 MG tablet Take 0.5 mg by mouth 2 (two) times daily before a meal.        Allergies  Allergen Reactions  . Latex Rash    SKIN BECOMES RAW  . Sulfa Antibiotics Rash    Review of Systems no fever weight is stable no dental problems dental cleaning twice a year  BP 138/65  Pulse 90  Resp 20  Ht 5\' 3"  (1.6 m)  Wt 153 lb (69.4 kg)  BMI 27.10 kg/m2  SpO2 97% Physical Exam Alert and comfortable accompanied by son Good carotid pulses no JVD Lungs clear Sternal incision well-healed Soft flow murmur through the aortic valve No diastolic murmur   Diagnostic Tests: CTA of the thoracic aorta shows no change in the mild to moderate ascending fusiform aneurysm measuring 4.4-4.5 cm  Impression: Stable fusiform ascending aneurysm. No significant risk for dissection until the diameter exceed 5.5 cm. Plan: Followup CTA in 18 months. Would  not recommend aortic valve replacement based on the current echo data and the patient's asymptomatic status. If the bioprosthetic valve ever deteriorates significantly she may be a candidate for a percutaneous aVR-TAVR using the valve in valve technique as he would be too high risk for redo aVR without excepting blood products.

## 2013-09-26 ENCOUNTER — Other Ambulatory Visit: Payer: Self-pay | Admitting: *Deleted

## 2013-09-26 DIAGNOSIS — I712 Thoracic aortic aneurysm, without rupture, unspecified: Secondary | ICD-10-CM

## 2013-09-26 DIAGNOSIS — I77810 Thoracic aortic ectasia: Secondary | ICD-10-CM

## 2013-09-26 DIAGNOSIS — I359 Nonrheumatic aortic valve disorder, unspecified: Secondary | ICD-10-CM

## 2013-10-25 ENCOUNTER — Encounter: Payer: Self-pay | Admitting: Cardiothoracic Surgery

## 2013-10-25 ENCOUNTER — Ambulatory Visit (INDEPENDENT_AMBULATORY_CARE_PROVIDER_SITE_OTHER): Payer: Medicare Other | Admitting: Cardiothoracic Surgery

## 2013-10-25 ENCOUNTER — Ambulatory Visit
Admission: RE | Admit: 2013-10-25 | Discharge: 2013-10-25 | Disposition: A | Discharge status: Home / Self Care | Payer: Medicare Other | Source: Ambulatory Visit | Attending: Cardiothoracic Surgery | Admitting: Cardiothoracic Surgery

## 2013-10-25 VITALS — BP 141/71 | HR 76 | Ht 63.0 in | Wt 153.0 lb

## 2013-10-25 DIAGNOSIS — I712 Thoracic aortic aneurysm, without rupture, unspecified: Secondary | ICD-10-CM

## 2013-10-25 DIAGNOSIS — I77810 Thoracic aortic ectasia: Secondary | ICD-10-CM

## 2013-10-25 DIAGNOSIS — Z952 Presence of prosthetic heart valve: Secondary | ICD-10-CM | POA: Insufficient documentation

## 2013-10-25 DIAGNOSIS — I359 Nonrheumatic aortic valve disorder, unspecified: Secondary | ICD-10-CM

## 2013-10-25 DIAGNOSIS — Z954 Presence of other heart-valve replacement: Secondary | ICD-10-CM

## 2013-10-25 MED ORDER — IOHEXOL 350 MG/ML SOLN
80.0000 mL | Freq: Once | INTRAVENOUS | Status: AC | PRN
  Administered 2013-10-25: 80 mL via INTRAVENOUS

## 2013-10-25 NOTE — Progress Notes (Signed)
PCP is Reynolds Bowl, MD Referring Provider is Reynolds Bowl, MD  Chief Complaint  Patient presents with  . F/U THORACIC    7 MO F/U    HPI: The patient presents for followup of a fusiform mild-moderate ascending thoracic aneurysm measuring 4.4 cm since 2012--asymptomatic. She is planning to have her left total hip replacement soon. She is status post aVR for aortic stenosis in the year 2000 with a tissue valve. The last echo report available is from 2012 which showed no significant AI. The patient has no symptoms of heart failure or angina. Her coronaries had no significant disease prior to her aVR in the year 2000.  Today a repeat CTA of the thoracic aorta shows a stable fusiform aneurysm measuring 4.4 centimeter in diameter. There is no penetrating ulcer or intramural hematoma. Lungs were clear. There's no pleural effusion. There's no abnormal mediastinal adenopathy.   Past Medical History  Diagnosis Date  . Ascending aorta dilatation   . Hypertension   . Diabetes   . Aortic stenosis   . Dyslipidemia     Past Surgical History  Procedure Laterality Date  . Aortic valve replacement  02/13/1999    Dr. Sharene Butters Trigt    No family history on file.  Social History History  Substance Use Topics  . Smoking status: Never Smoker   . Smokeless tobacco: Never Used  . Alcohol Use: No    Current Outpatient Prescriptions  Medication Sig Dispense Refill  . aspirin 81 MG tablet Take 160 mg by mouth daily.      Marland Kitchen docusate sodium (COLACE) 100 MG capsule Take 100 mg by mouth 2 (two) times daily.      Marland Kitchen donepezil (ARICEPT) 10 MG tablet Take 10 mg by mouth 2 (two) times daily.      . fluticasone (FLONASE) 50 MCG/ACT nasal spray Place 2 sprays into the nose daily.      . folic acid (FOLVITE) 400 MCG tablet Take 400 mcg by mouth daily.      Marland Kitchen losartan-hydrochlorothiazide (HYZAAR) 100-25 MG per tablet Take 1 tablet by mouth daily.      . multivitamin (THERAGRAN) per tablet Take 1 tablet by  mouth daily.      . nortriptyline (PAMELOR) 10 MG capsule Take 10 mg by mouth at bedtime.      . repaglinide (PRANDIN) 0.5 MG tablet Take 0.5 mg by mouth 2 (two) times daily before a meal.       No current facility-administered medications for this visit.    Allergies  Allergen Reactions  . Latex Rash    SKIN BECOMES RAW  . Sulfa Antibiotics Rash    Review of Systems patient feels well with an her left hip pain. She plans to have a total hip replacement at the [facility]. BP 141/71  Pulse 76  Ht 5\' 3"  (1.6 m)  Wt 153 lb (69.4 kg)  BMI 27.11 kg/m2  SpO2 98% Physical Exam Alert and pleasant, comfortable HEENT normocephalic pupils equal Neck without JVD or adenopathy Heart rhythm regular, soft systolic flow murmur through the aortic valve prosthesis, no diastolic murmur noted Abdomen soft without pulsatile mass Extremities with excellent pulses no pedal edema  Diagnostic Tests:  CTA of the thoracic aorta shows the fusiform aneurysm to be stable measuring 4.4 cm. This represents no significant risk to the patient concerning her hip surgery. Impression: Stable fusiform ascending aneurysm. Recommend repeat echocardiogram by her cardiologist in [facility] heart undergoing THR.  Plan: Return for CTA  of the thoracic aorta in 2 years.

## 2015-03-06 ENCOUNTER — Other Ambulatory Visit (HOSPITAL_COMMUNITY): Payer: Self-pay | Admitting: Orthopaedic Surgery

## 2015-03-08 ENCOUNTER — Encounter (HOSPITAL_COMMUNITY): Payer: Self-pay

## 2015-03-08 ENCOUNTER — Encounter (HOSPITAL_COMMUNITY)
Admission: RE | Admit: 2015-03-08 | Discharge: 2015-03-08 | Disposition: A | Discharge status: Home / Self Care | Payer: Medicare Other | Source: Ambulatory Visit | Attending: Orthopaedic Surgery | Admitting: Orthopaedic Surgery

## 2015-03-08 DIAGNOSIS — Z952 Presence of prosthetic heart valve: Secondary | ICD-10-CM | POA: Diagnosis not present

## 2015-03-08 DIAGNOSIS — M1612 Unilateral primary osteoarthritis, left hip: Secondary | ICD-10-CM | POA: Insufficient documentation

## 2015-03-08 DIAGNOSIS — I498 Other specified cardiac arrhythmias: Secondary | ICD-10-CM | POA: Diagnosis not present

## 2015-03-08 DIAGNOSIS — I10 Essential (primary) hypertension: Secondary | ICD-10-CM | POA: Diagnosis not present

## 2015-03-08 DIAGNOSIS — Z7984 Long term (current) use of oral hypoglycemic drugs: Secondary | ICD-10-CM | POA: Diagnosis not present

## 2015-03-08 DIAGNOSIS — E119 Type 2 diabetes mellitus without complications: Secondary | ICD-10-CM | POA: Diagnosis not present

## 2015-03-08 DIAGNOSIS — I77810 Thoracic aortic ectasia: Secondary | ICD-10-CM | POA: Diagnosis not present

## 2015-03-08 DIAGNOSIS — Z79899 Other long term (current) drug therapy: Secondary | ICD-10-CM | POA: Insufficient documentation

## 2015-03-08 DIAGNOSIS — Z01818 Encounter for other preprocedural examination: Secondary | ICD-10-CM | POA: Diagnosis not present

## 2015-03-08 DIAGNOSIS — Z01812 Encounter for preprocedural laboratory examination: Secondary | ICD-10-CM | POA: Diagnosis not present

## 2015-03-08 DIAGNOSIS — E785 Hyperlipidemia, unspecified: Secondary | ICD-10-CM | POA: Insufficient documentation

## 2015-03-08 DIAGNOSIS — Z7982 Long term (current) use of aspirin: Secondary | ICD-10-CM | POA: Diagnosis not present

## 2015-03-08 HISTORY — DX: Pneumonia, unspecified organism: J18.9

## 2015-03-08 LAB — BASIC METABOLIC PANEL
Anion gap: 10 (ref 5–15)
BUN: 12 mg/dL (ref 6–20)
CO2: 23 mmol/L (ref 22–32)
CREATININE: 1.2 mg/dL — AB (ref 0.44–1.00)
Calcium: 10.4 mg/dL — ABNORMAL HIGH (ref 8.9–10.3)
Chloride: 105 mmol/L (ref 101–111)
GFR calc Af Amer: 46 mL/min — ABNORMAL LOW (ref >60)
GFR calc non Af Amer: 40 mL/min — ABNORMAL LOW (ref >60)
GLUCOSE: 100 mg/dL — AB (ref 65–99)
Potassium: 4 mmol/L (ref 3.5–5.1)
Sodium: 138 mmol/L (ref 135–145)

## 2015-03-08 LAB — NO BLOOD PRODUCTS

## 2015-03-08 LAB — CBC
HCT: 40.7 % (ref 36.0–46.0)
Hemoglobin: 13.5 g/dL (ref 12.0–15.0)
MCH: 27.7 pg (ref 26.0–34.0)
MCHC: 33.2 g/dL (ref 30.0–36.0)
MCV: 83.4 fL (ref 78.0–100.0)
PLATELETS: 177 10*3/uL (ref 150–400)
RBC: 4.88 MIL/uL (ref 3.87–5.11)
RDW: 14.3 % (ref 11.5–15.5)
WBC: 5.8 10*3/uL (ref 4.0–10.5)

## 2015-03-08 LAB — SURGICAL PCR SCREEN
MRSA, PCR: NEGATIVE
Staphylococcus aureus: NEGATIVE

## 2015-03-08 LAB — GLUCOSE, CAPILLARY: Glucose-Capillary: 121 mg/dL — ABNORMAL HIGH (ref 65–99)

## 2015-03-08 NOTE — Progress Notes (Signed)
SPOKE WITH  AMY AT DR. BLACKMAN'S OFFICE WHO STATED PATIENT DID NOT NEED TO STOP ASPIRIN.  REQUESTED CARDIAC TESTS, OFFICE NOTES FROM [facility] [place]  [phone]

## 2015-03-08 NOTE — Pre-Procedure Instructions (Addendum)
[name] [name]  03/08/2015      [facility] - [place], [place] - [address] [place] [address] Phone: [phone] Fax: [phone]    Your procedure is scheduled on  Tuesday  03/19/15   Report to [facility] Admitting at 1100 A.M.  Call this number if you have problems the morning of surgery:  [phone]   Remember:  Do not eat food or drink liquids after midnight.  Take these medicines the morning of surgery with A SIP OF WATER  (MULTIVITAMIN, NO HERBAL MEDICINES, GOODY POWDERS/ BC'S, IBUPROFEN/ ADVIL/ MOTRIN/ ALEVE) How to Manage Your Diabetes Before Surgery   Why is it important to control my blood sugar before and after surgery?   Improving blood sugar levels before and after surgery helps healing and can limit problems.  A way of improving blood sugar control is eating a healthy diet by:  - Eating less sugar and carbohydrates  - Increasing activity/exercise  - Talk with your doctor about reaching your blood sugar goals  High blood sugars (greater than 180 mg/dL) can raise your risk of infections and slow down your recovery so you will need to focus on controlling your diabetes during the weeks before surgery.  Make sure that the doctor who takes care of your diabetes knows about your planned surgery including the date and location.  How do I manage my blood sugars before surgery?   Check your blood sugar at least 4 times a day, 2 days before surgery to make sure that they are not too high or low.   Check your blood sugar the morning of your surgery when you wake up and every 2               hours until you get to the Short-Stay unit.  If your blood sugar is less than 70 mg/dL, you will need to treat for low blood sugar by:  Treat a low blood sugar (less than 70 mg/dL) with 1/2 cup of clear juice (cranberry or apple), 4 glucose tablets, OR glucose gel.  Recheck blood sugar in 15 minutes after treatment (to make sure it  is greater than 70 mg/dL).  If blood sugar is not greater than 70 mg/dL on re-check, call [phone] for further instructions.   Report your blood sugar to the Short-Stay nurse when you get to Short-Stay.  References:  [facility], 2007 "How to Manage your Diabetes Before and After Surgery".  What do I do about my diabetes medications?   Do not take oral diabetes medicines (pills) the morning of surgery.      Do not take other diabetes injectables the day of surgery including Byetta, Victoza, Bydureon, and Trulicity.    If your CBG is greater than 220 mg/dL, you may take 1/2 of your sliding scale (correction) dose of insulin.   For patients with "Insulin Pumps":  Contact your diabetes doctor for specific instructions before surgery.   Decrease basal insulin rates by 20% at midnight the night before surgery.  Note that if your surgery is planned to be longer than 2 hours, your insulin pump will be removed and intravenous (IV) insulin will be started and managed by the nurses and anesthesiologist.  You will be able to restart your insulin pump once you are awake and able to manage it.  Make sure to bring insulin pump supplies to the hospital with you in case your site needs to be  changed.        Do not wear jewelry, make-up or nail polish.  Do not wear lotions, powders, or perfumes.  You may wear deodorant.  Do not shave 48 hours prior to surgery.  Men may shave face and neck.  Do not bring valuables to the hospital.  [facility] is not responsible for any belongings or valuables.  Contacts, dentures or bridgework may not be worn into surgery.  Leave your suitcase in the car.  After surgery it may be brought to your room.  For patients admitted to the hospital, discharge time will be determined by your treatment team.  Patients discharged the day of surgery will not be allowed to drive home.   Name and phone number of your driver:   Special  instructions:  [facility] - Preparing for Surgery  Before surgery, you can play an important role.  Because skin is not sterile, your skin needs to be as free of germs as possible.  You can reduce the number of germs on you skin by washing with CHG (chlorahexidine gluconate) soap before surgery.  CHG is an antiseptic cleaner which kills germs and bonds with the skin to continue killing germs even after washing.  Please DO NOT use if you have an allergy to CHG or antibacterial soaps.  If your skin becomes reddened/irritated stop using the CHG and inform your nurse when you arrive at Short Stay.  Do not shave (including legs and underarms) for at least 48 hours prior to the first CHG shower.  You may shave your face.  Please follow these instructions carefully:   1.  Shower with CHG Soap the night before surgery and the                                morning of Surgery.  2.  If you choose to wash your hair, wash your hair first as usual with your       normal shampoo.  3.  After you shampoo, rinse your hair and body thoroughly to remove the                      Shampoo.  4.  Use CHG as you would any other liquid soap.  You can apply chg directly       to the skin and wash gently with scrungie or a clean washcloth.  5.  Apply the CHG Soap to your body ONLY FROM THE NECK DOWN.        Do not use on open wounds or open sores.  Avoid contact with your eyes,       ears, mouth and genitals (private parts).  Wash genitals (private parts)       with your normal soap.  6.  Wash thoroughly, paying special attention to the area where your surgery        will be performed.  7.  Thoroughly rinse your body with warm water from the neck down.  8.  DO NOT shower/wash with your normal soap after using and rinsing off       the CHG Soap.  9.  Pat yourself dry with a clean towel.            10.  Wear clean pajamas.            11.  Place clean sheets on your bed the night of your first shower  and do not        sleep  with pets.  Day of Surgery  Do not apply any lotions/deoderants the morning of surgery.  Please wear clean clothes to the hospital/surgery center.    Please read over the following fact sheets that you were given. Pain Booklet, Coughing and Deep Breathing, MRSA Information and Surgical Site Infection Prevention

## 2015-03-09 LAB — HEMOGLOBIN A1C
Hgb A1c MFr Bld: 6.5 % — ABNORMAL HIGH (ref 4.8–5.6)
Mean Plasma Glucose: 140 mg/dL

## 2015-03-12 NOTE — Progress Notes (Signed)
Anesthesia Chart Review:  Pt is 82 year old female scheduled for L total hip arthroplasty anterior approach 03/19/2015 with Dr. Maureen Ralphs. [name].   Cardiologist is Dr. Vevelyn FrancoisJack [name] at [facility] [place], last office visit 01/11/15.   PMH includes:  HTN, aortic stenosis (s/p AVR 2000), ascending aorta dilatation, DM, hyperlipidemia. Never smoker. BMI 27  Medications include: ASA, aricept, losartan-hctz, repaglinide.   Preoperative labs reviewed.  HgbA1c 6.5, glucose 100.  EKG 03/08/15: NSR with sinus arrhythmia. LAD. Cannot rule out Anteroseptal infarct, age undetermined. Since previous tracing 12/24/07 Septal R waves (V2 & V3 no longer present), otherwise no significant change per Dr. Elissa HeftyHarding's interpretation.   Echo 01/11/15:  - bioprosthetic aortic valve with mildly increased gradients stable compared to previous study; there is also mild aortic insufficiency - Normal LV systolic function, EF 60-65% - mild LVH - diastolic dysfunction - grade I (normal filling pressures) - mitral annular calcification - Mild to moderate dilated LA - Dilated ascending aorta (4.2 cm) - Normal RV systolic function - Mild tricuspid regurgitation - Borderline elevated pulmonary artery systolic pressure  CT angio chest 10/22/13: Stable ascending thoracic aortic aneurysm 4.5 x 4.5 cm is noted. Status post aortic valve replacement. Stable enlargement of left thyroid lobe is noted.  Dr. Donata ClayVan [name] is following pt's aortic aneurysm, last office visit 10/2013 with f/u CTA recommended in 2 years.   If no changes, I anticipate pt can proceed with surgery as scheduled.   [name] Mastngela [name], FNP-BC [facility] Phone: [phone] 03/12/2015 4:21 PM

## 2015-03-18 MED ORDER — CEFAZOLIN SODIUM-DEXTROSE 2-3 GM-% IV SOLR
2.0000 g | INTRAVENOUS | Status: AC
  Administered 2015-03-19: 2 g via INTRAVENOUS
  Filled 2015-03-18: qty 50

## 2015-03-18 MED ORDER — TRANEXAMIC ACID 1000 MG/10ML IV SOLN
1000.0000 mg | INTRAVENOUS | Status: AC
  Administered 2015-03-19: 1000 mg via INTRAVENOUS
  Filled 2015-03-18: qty 10

## 2015-03-18 NOTE — Anesthesia Preprocedure Evaluation (Addendum)
Anesthesia Evaluation  Patient identified by MRN, date of birth, ID band Patient awake    Reviewed: Allergy & Precautions, NPO status , Patient's Chart, lab work & pertinent test results  Airway Mallampati: II   Neck ROM: Full    Dental  (+) Dental Advisory Given, Teeth Intact   Pulmonary neg pulmonary ROS,    breath sounds clear to auscultation       Cardiovascular hypertension, Pt. on medications negative cardio ROS   Rhythm:Regular  SP AVR 2000 with tissue valve, 4.5cm ascending AA   Neuro/Psych negative neurological ROS  negative psych ROS   GI/Hepatic negative GI ROS, Neg liver ROS,   Endo/Other  negative endocrine ROSdiabetes, Type 2  Renal/GU negative Renal ROS  negative genitourinary   Musculoskeletal negative musculoskeletal ROS (+)   Abdominal   Peds negative pediatric ROS (+)  Hematology negative hematology ROS (+) JEHOVAH'S [id number]   Anesthesia Other Findings Refuses blood products  Reproductive/Obstetrics negative OB ROS                           Anesthesia Physical Anesthesia Plan  ASA: III  Anesthesia Plan: Spinal   Post-op Pain Management:    Induction: Intravenous  Airway Management Planned: Nasal Cannula and LMA  Additional Equipment:   Intra-op Plan:   Post-operative Plan:   Informed Consent: I have reviewed the patients History and Physical, chart, labs and discussed the procedure including the risks, benefits and alternatives for the proposed anesthesia with the patient or authorized representative who has indicated his/her understanding and acceptance.     Plan Discussed with:   Anesthesia Plan Comments: (Refuses blood products)       Anesthesia Quick Evaluation

## 2015-03-19 ENCOUNTER — Inpatient Hospital Stay (HOSPITAL_COMMUNITY)
Admission: RE | Admit: 2015-03-19 | Discharge: 2015-03-22 | DRG: 470 | Disposition: A | Discharge status: Skilled Nursing Facility | Payer: Medicare Other | Source: Ambulatory Visit | Attending: Orthopaedic Surgery | Admitting: Orthopaedic Surgery

## 2015-03-19 ENCOUNTER — Inpatient Hospital Stay (HOSPITAL_COMMUNITY): Payer: Medicare Other

## 2015-03-19 ENCOUNTER — Inpatient Hospital Stay (HOSPITAL_COMMUNITY): Payer: Medicare Other | Admitting: Emergency Medicine

## 2015-03-19 ENCOUNTER — Encounter (HOSPITAL_COMMUNITY): Payer: Self-pay | Admitting: Certified Registered Nurse Anesthetist

## 2015-03-19 ENCOUNTER — Encounter (HOSPITAL_COMMUNITY)
Admission: RE | Disposition: A | Discharge status: Skilled Nursing Facility | Payer: Self-pay | Source: Ambulatory Visit | Attending: Orthopaedic Surgery

## 2015-03-19 ENCOUNTER — Inpatient Hospital Stay (HOSPITAL_COMMUNITY): Payer: Medicare Other | Admitting: Anesthesiology

## 2015-03-19 DIAGNOSIS — Z96653 Presence of artificial knee joint, bilateral: Secondary | ICD-10-CM | POA: Diagnosis present

## 2015-03-19 DIAGNOSIS — E785 Hyperlipidemia, unspecified: Secondary | ICD-10-CM | POA: Diagnosis present

## 2015-03-19 DIAGNOSIS — Z96642 Presence of left artificial hip joint: Secondary | ICD-10-CM

## 2015-03-19 DIAGNOSIS — I77810 Thoracic aortic ectasia: Secondary | ICD-10-CM | POA: Diagnosis present

## 2015-03-19 DIAGNOSIS — M25452 Effusion, left hip: Secondary | ICD-10-CM | POA: Diagnosis present

## 2015-03-19 DIAGNOSIS — Z419 Encounter for procedure for purposes other than remedying health state, unspecified: Secondary | ICD-10-CM

## 2015-03-19 DIAGNOSIS — K5900 Constipation, unspecified: Secondary | ICD-10-CM | POA: Diagnosis not present

## 2015-03-19 DIAGNOSIS — M1612 Unilateral primary osteoarthritis, left hip: Principal | ICD-10-CM

## 2015-03-19 DIAGNOSIS — Z7982 Long term (current) use of aspirin: Secondary | ICD-10-CM | POA: Diagnosis not present

## 2015-03-19 DIAGNOSIS — I10 Essential (primary) hypertension: Secondary | ICD-10-CM | POA: Diagnosis present

## 2015-03-19 DIAGNOSIS — Z9842 Cataract extraction status, left eye: Secondary | ICD-10-CM

## 2015-03-19 DIAGNOSIS — E119 Type 2 diabetes mellitus without complications: Secondary | ICD-10-CM | POA: Diagnosis present

## 2015-03-19 DIAGNOSIS — Z9841 Cataract extraction status, right eye: Secondary | ICD-10-CM

## 2015-03-19 DIAGNOSIS — Z952 Presence of prosthetic heart valve: Secondary | ICD-10-CM

## 2015-03-19 DIAGNOSIS — Z961 Presence of intraocular lens: Secondary | ICD-10-CM | POA: Diagnosis present

## 2015-03-19 DIAGNOSIS — I350 Nonrheumatic aortic (valve) stenosis: Secondary | ICD-10-CM | POA: Diagnosis present

## 2015-03-19 HISTORY — PX: TOTAL HIP ARTHROPLASTY: SHX124

## 2015-03-19 LAB — GLUCOSE, CAPILLARY
GLUCOSE-CAPILLARY: 85 mg/dL (ref 65–99)
Glucose-Capillary: 131 mg/dL — ABNORMAL HIGH (ref 65–99)

## 2015-03-19 SURGERY — ARTHROPLASTY, HIP, TOTAL, ANTERIOR APPROACH
Anesthesia: Spinal | Laterality: Left

## 2015-03-19 MED ORDER — MENTHOL 3 MG MT LOZG: 1.0000 | LOZENGE | OROMUCOSAL | Status: DC | PRN

## 2015-03-19 MED ORDER — LIDOCAINE HCL (CARDIAC) 20 MG/ML IV SOLN
INTRAVENOUS | Status: DC | PRN
  Administered 2015-03-19: 60 mg via INTRAVENOUS

## 2015-03-19 MED ORDER — POLYETHYLENE GLYCOL 3350 17 G PO PACK
17.0000 g | PACK | Freq: Every day | ORAL | Status: DC | PRN
  Filled 2015-03-19: qty 1

## 2015-03-19 MED ORDER — FLUTICASONE PROPIONATE 50 MCG/ACT NA SUSP: 2.0000 | Freq: Every day | NASAL | Status: DC | PRN

## 2015-03-19 MED ORDER — NORTRIPTYLINE HCL 10 MG PO CAPS
10.0000 mg | ORAL_CAPSULE | Freq: Every day | ORAL | Status: DC
  Administered 2015-03-19 – 2015-03-21 (×3): 10 mg via ORAL
  Filled 2015-03-19 (×4): qty 1

## 2015-03-19 MED ORDER — FERROUS SULFATE 325 (65 FE) MG PO TABS
325.0000 mg | ORAL_TABLET | Freq: Three times a day (TID) | ORAL | Status: DC
  Administered 2015-03-19 – 2015-03-22 (×7): 325 mg via ORAL
  Filled 2015-03-19 (×8): qty 1

## 2015-03-19 MED ORDER — ADULT MULTIVITAMIN W/MINERALS CH
1.0000 | ORAL_TABLET | Freq: Every day | ORAL | Status: DC
Start: 2015-03-19 — End: 2015-03-22
  Administered 2015-03-19 – 2015-03-22 (×4): 1 via ORAL
  Filled 2015-03-19 (×4): qty 1

## 2015-03-19 MED ORDER — ACETAMINOPHEN 325 MG PO TABS: 650.0000 mg | ORAL_TABLET | Freq: Four times a day (QID) | ORAL | Status: DC | PRN

## 2015-03-19 MED ORDER — METHOCARBAMOL 500 MG PO TABS
500.0000 mg | ORAL_TABLET | Freq: Four times a day (QID) | ORAL | Status: DC | PRN
  Administered 2015-03-22: 500 mg via ORAL
  Filled 2015-03-19: qty 1

## 2015-03-19 MED ORDER — PHENOL 1.4 % MT LIQD
1.0000 | OROMUCOSAL | Status: DC | PRN
Start: 2015-03-19 — End: 2015-03-22

## 2015-03-19 MED ORDER — PHENYLEPHRINE HCL 10 MG/ML IJ SOLN
INTRAMUSCULAR | Status: DC | PRN
  Administered 2015-03-19 (×2): 80 ug via INTRAVENOUS

## 2015-03-19 MED ORDER — DEXTROSE 5 % IV SOLN
500.0000 mg | Freq: Four times a day (QID) | INTRAVENOUS | Status: DC | PRN
  Filled 2015-03-19: qty 5

## 2015-03-19 MED ORDER — DIPHENHYDRAMINE HCL 12.5 MG/5ML PO ELIX: 12.5000 mg | ORAL_SOLUTION | ORAL | Status: DC | PRN

## 2015-03-19 MED ORDER — DOCUSATE SODIUM 100 MG PO CAPS
100.0000 mg | ORAL_CAPSULE | Freq: Two times a day (BID) | ORAL | Status: DC
  Administered 2015-03-19 – 2015-03-20 (×2): 100 mg via ORAL
  Filled 2015-03-19 (×2): qty 1

## 2015-03-19 MED ORDER — PHENYLEPHRINE HCL 10 MG/ML IJ SOLN
10.0000 mg | INTRAMUSCULAR | Status: DC | PRN
  Administered 2015-03-19: 20 ug/min via INTRAVENOUS

## 2015-03-19 MED ORDER — LIDOCAINE HCL (CARDIAC) 20 MG/ML IV SOLN
INTRAVENOUS | Status: AC
  Filled 2015-03-19: qty 5

## 2015-03-19 MED ORDER — ZOLPIDEM TARTRATE 5 MG PO TABS
5.0000 mg | ORAL_TABLET | Freq: Every evening | ORAL | Status: DC | PRN
  Administered 2015-03-20: 5 mg via ORAL
  Filled 2015-03-19: qty 1

## 2015-03-19 MED ORDER — OXYCODONE HCL 5 MG PO TABS
5.0000 mg | ORAL_TABLET | ORAL | Status: DC | PRN
  Administered 2015-03-19 – 2015-03-20 (×6): 5 mg via ORAL
  Administered 2015-03-20 – 2015-03-22 (×6): 10 mg via ORAL
  Filled 2015-03-19 (×7): qty 1
  Filled 2015-03-19 (×4): qty 2
  Filled 2015-03-19: qty 1
  Filled 2015-03-19: qty 2

## 2015-03-19 MED ORDER — PROMETHAZINE HCL 25 MG/ML IJ SOLN
6.2500 mg | INTRAMUSCULAR | Status: DC | PRN
Start: 2015-03-19 — End: 2015-03-19

## 2015-03-19 MED ORDER — ONDANSETRON HCL 4 MG PO TABS: 4.0000 mg | ORAL_TABLET | Freq: Four times a day (QID) | ORAL | Status: DC | PRN

## 2015-03-19 MED ORDER — ASPIRIN EC 325 MG PO TBEC
325.0000 mg | DELAYED_RELEASE_TABLET | Freq: Every day | ORAL | Status: DC
  Administered 2015-03-20 – 2015-03-22 (×3): 325 mg via ORAL
  Filled 2015-03-19 (×3): qty 1

## 2015-03-19 MED ORDER — LOSARTAN POTASSIUM-HCTZ 100-25 MG PO TABS: 1.0000 | ORAL_TABLET | Freq: Every day | ORAL | Status: DC

## 2015-03-19 MED ORDER — PROPOFOL 500 MG/50ML IV EMUL
INTRAVENOUS | Status: DC | PRN
  Administered 2015-03-19: 50 ug/kg/min via INTRAVENOUS

## 2015-03-19 MED ORDER — METOCLOPRAMIDE HCL 5 MG PO TABS: 5.0000 mg | ORAL_TABLET | Freq: Three times a day (TID) | ORAL | Status: DC | PRN

## 2015-03-19 MED ORDER — METOCLOPRAMIDE HCL 5 MG/ML IJ SOLN: 5.0000 mg | Freq: Three times a day (TID) | INTRAMUSCULAR | Status: DC | PRN

## 2015-03-19 MED ORDER — FOLIC ACID 0.5 MG HALF TAB
500.0000 ug | ORAL_TABLET | Freq: Every day | ORAL | Status: DC
  Filled 2015-03-19: qty 1

## 2015-03-19 MED ORDER — BUPIVACAINE IN DEXTROSE 0.75-8.25 % IT SOLN
INTRATHECAL | Status: DC | PRN
  Administered 2015-03-19: 2 mL via INTRATHECAL

## 2015-03-19 MED ORDER — HYDROCHLOROTHIAZIDE 25 MG PO TABS
25.0000 mg | ORAL_TABLET | Freq: Every day | ORAL | Status: DC
  Administered 2015-03-20 – 2015-03-22 (×3): 25 mg via ORAL
  Filled 2015-03-19 (×3): qty 1

## 2015-03-19 MED ORDER — PROPOFOL 10 MG/ML IV BOLUS
INTRAVENOUS | Status: DC | PRN
  Administered 2015-03-19: 20 mg via INTRAVENOUS

## 2015-03-19 MED ORDER — ONDANSETRON HCL 4 MG/2ML IJ SOLN
4.0000 mg | Freq: Four times a day (QID) | INTRAMUSCULAR | Status: DC | PRN
  Filled 2015-03-19: qty 2

## 2015-03-19 MED ORDER — CEFAZOLIN SODIUM 1-5 GM-% IV SOLN
1.0000 g | Freq: Four times a day (QID) | INTRAVENOUS | Status: AC
  Administered 2015-03-19 (×2): 1 g via INTRAVENOUS
  Filled 2015-03-19 (×2): qty 50

## 2015-03-19 MED ORDER — LOSARTAN POTASSIUM 50 MG PO TABS
100.0000 mg | ORAL_TABLET | Freq: Every day | ORAL | Status: DC
  Administered 2015-03-20 – 2015-03-22 (×3): 100 mg via ORAL
  Filled 2015-03-19 (×3): qty 2

## 2015-03-19 MED ORDER — HYDROMORPHONE HCL 1 MG/ML IJ SOLN: 0.5000 mg | INTRAMUSCULAR | Status: DC | PRN

## 2015-03-19 MED ORDER — ONDANSETRON HCL 4 MG/2ML IJ SOLN
INTRAMUSCULAR | Status: AC
  Filled 2015-03-19: qty 4

## 2015-03-19 MED ORDER — SODIUM CHLORIDE 0.9 % IV SOLN
INTRAVENOUS | Status: DC
  Administered 2015-03-19: 22:00:00 via INTRAVENOUS

## 2015-03-19 MED ORDER — PHENYLEPHRINE HCL 10 MG/ML IJ SOLN
INTRAMUSCULAR | Status: AC
  Filled 2015-03-19: qty 2

## 2015-03-19 MED ORDER — LACTATED RINGERS IV SOLN
INTRAVENOUS | Status: DC
  Administered 2015-03-19: 11:00:00 via INTRAVENOUS

## 2015-03-19 MED ORDER — HYDROMORPHONE HCL 1 MG/ML IJ SOLN: 0.2500 mg | INTRAMUSCULAR | Status: DC | PRN

## 2015-03-19 MED ORDER — DONEPEZIL HCL 10 MG PO TABS
10.0000 mg | ORAL_TABLET | Freq: Every day | ORAL | Status: DC
  Administered 2015-03-19 – 2015-03-21 (×3): 10 mg via ORAL
  Filled 2015-03-19 (×3): qty 1

## 2015-03-19 MED ORDER — ACETAMINOPHEN 650 MG RE SUPP: 650.0000 mg | Freq: Four times a day (QID) | RECTAL | Status: DC | PRN

## 2015-03-19 MED ORDER — REPAGLINIDE 0.5 MG PO TABS
0.5000 mg | ORAL_TABLET | Freq: Two times a day (BID) | ORAL | Status: DC
  Administered 2015-03-19 – 2015-03-21 (×5): 0.5 mg via ORAL
  Filled 2015-03-19 (×8): qty 1

## 2015-03-19 MED ORDER — ONDANSETRON HCL 4 MG/2ML IJ SOLN
INTRAMUSCULAR | Status: DC | PRN
  Administered 2015-03-19: 4 mg via INTRAVENOUS

## 2015-03-19 MED ORDER — MEPERIDINE HCL 25 MG/ML IJ SOLN: 6.2500 mg | INTRAMUSCULAR | Status: DC | PRN

## 2015-03-19 MED ORDER — 0.9 % SODIUM CHLORIDE (POUR BTL) OPTIME
TOPICAL | Status: DC | PRN
  Administered 2015-03-19: 1000 mL

## 2015-03-19 SURGICAL SUPPLY — 58 items
APL SKNCLS STERI-STRIP NONHPOA (GAUZE/BANDAGES/DRESSINGS) ×1
BENZOIN TINCTURE PRP APPL 2/3 (GAUZE/BANDAGES/DRESSINGS) ×3 IMPLANT
BLADE SAW SGTL 18X1.27X75 (BLADE) ×2 IMPLANT
BLADE SAW SGTL 18X1.27X75MM (BLADE) ×1
BLADE SURG ROTATE 9660 (MISCELLANEOUS) IMPLANT
CAPT HIP TOTAL 2 ×2 IMPLANT
CELLS DAT CNTRL 66122 CELL SVR (MISCELLANEOUS) ×1 IMPLANT
CLOSURE WOUND 1/2 X4 (GAUZE/BANDAGES/DRESSINGS) ×2
COVER SURGICAL LIGHT HANDLE (MISCELLANEOUS) ×3 IMPLANT
DRAPE C-ARM 42X72 X-RAY (DRAPES) ×3 IMPLANT
DRAPE STERI IOBAN 125X83 (DRAPES) ×3 IMPLANT
DRAPE U-SHAPE 47X51 STRL (DRAPES) ×9 IMPLANT
DRESSING AQUACEL AQ EXTRA 4X5 (GAUZE/BANDAGES/DRESSINGS) ×2 IMPLANT
DRSG AQUACEL AG ADV 3.5X10 (GAUZE/BANDAGES/DRESSINGS) ×3 IMPLANT
DURAPREP 26ML APPLICATOR (WOUND CARE) ×3 IMPLANT
ELECT BLADE 4.0 EZ CLEAN MEGAD (MISCELLANEOUS) ×3
ELECT BLADE 6.5 EXT (BLADE) IMPLANT
ELECT REM PT RETURN 9FT ADLT (ELECTROSURGICAL) ×3
ELECTRODE BLDE 4.0 EZ CLN MEGD (MISCELLANEOUS) ×1 IMPLANT
ELECTRODE REM PT RTRN 9FT ADLT (ELECTROSURGICAL) ×1 IMPLANT
FACESHIELD WRAPAROUND (MASK) ×6 IMPLANT
FACESHIELD WRAPAROUND OR TEAM (MASK) ×2 IMPLANT
GAUZE XEROFORM 1X8 LF (GAUZE/BANDAGES/DRESSINGS) ×2 IMPLANT
GLOVE BIOGEL PI IND STRL 8 (GLOVE) ×2 IMPLANT
GLOVE BIOGEL PI INDICATOR 8 (GLOVE) ×4
GLOVE ECLIPSE 8.0 STRL XLNG CF (GLOVE) ×3 IMPLANT
GLOVE ORTHO TXT STRL SZ7.5 (GLOVE) ×6 IMPLANT
GLOVE SURG SS PI 8.0 STRL IVOR (GLOVE) ×2 IMPLANT
GOWN STRL REUS W/ TWL LRG LVL3 (GOWN DISPOSABLE) ×2 IMPLANT
GOWN STRL REUS W/ TWL XL LVL3 (GOWN DISPOSABLE) ×2 IMPLANT
GOWN STRL REUS W/TWL LRG LVL3 (GOWN DISPOSABLE) ×6
GOWN STRL REUS W/TWL XL LVL3 (GOWN DISPOSABLE) ×6
HANDPIECE INTERPULSE COAX TIP (DISPOSABLE) ×3
KIT BASIN OR (CUSTOM PROCEDURE TRAY) ×3 IMPLANT
KIT ROOM TURNOVER OR (KITS) ×3 IMPLANT
MANIFOLD NEPTUNE II (INSTRUMENTS) ×3 IMPLANT
NS IRRIG 1000ML POUR BTL (IV SOLUTION) ×3 IMPLANT
PACK TOTAL JOINT (CUSTOM PROCEDURE TRAY) ×3 IMPLANT
PACK UNIVERSAL I (CUSTOM PROCEDURE TRAY) ×3 IMPLANT
PAD ARMBOARD 7.5X6 YLW CONV (MISCELLANEOUS) ×5 IMPLANT
RETRACTOR WND ALEXIS 18 MED (MISCELLANEOUS) ×1 IMPLANT
RTRCTR WOUND ALEXIS 18CM MED (MISCELLANEOUS) ×3
SET HNDPC FAN SPRY TIP SCT (DISPOSABLE) ×1 IMPLANT
STAPLER VISISTAT 35W (STAPLE) ×2 IMPLANT
STRIP CLOSURE SKIN 1/2X4 (GAUZE/BANDAGES/DRESSINGS) ×4 IMPLANT
SUT ETHIBOND NAB CT1 #1 30IN (SUTURE) ×3 IMPLANT
SUT MNCRL AB 4-0 PS2 18 (SUTURE) IMPLANT
SUT VIC AB 0 CT1 27 (SUTURE) ×3
SUT VIC AB 0 CT1 27XBRD ANBCTR (SUTURE) ×1 IMPLANT
SUT VIC AB 1 CT1 27 (SUTURE) ×3
SUT VIC AB 1 CT1 27XBRD ANBCTR (SUTURE) ×1 IMPLANT
SUT VIC AB 2-0 CT1 27 (SUTURE) ×6
SUT VIC AB 2-0 CT1 TAPERPNT 27 (SUTURE) ×1 IMPLANT
TOWEL OR 17X24 6PK STRL BLUE (TOWEL DISPOSABLE) ×3 IMPLANT
TOWEL OR 17X26 10 PK STRL BLUE (TOWEL DISPOSABLE) ×3 IMPLANT
TRAY FOLEY CATH 16FRSI W/METER (SET/KITS/TRAYS/PACK) IMPLANT
WATER STERILE IRR 1000ML POUR (IV SOLUTION) ×6 IMPLANT
YANKAUER SUCT BULB TIP NO VENT (SUCTIONS) ×2 IMPLANT

## 2015-03-19 NOTE — Brief Op Note (Signed)
03/19/2015  2:10 PM  PATIENT:  [name] [name]  82 y.o. female  PRE-OPERATIVE DIAGNOSIS:  severe osteoarthritis left hip  POST-OPERATIVE DIAGNOSIS:  severe osteoarthritis left hip  PROCEDURE:  Procedure(s): LEFT TOTAL HIP ARTHROPLASTY ANTERIOR APPROACH (Left)  SURGEON:  Surgeon(s) and Role:    * Kathryne Hitchhristopher Y Blackman, MD - Primary  PHYSICIAN ASSISTANT: Rexene EdisonGil clark, PA-C  ANESTHESIA:   spinal  EBL:  Total I/O In: -  Out: 400 [Urine:200; Blood:200]  BLOOD ADMINISTERED:none  DRAINS: none   LOCAL MEDICATIONS USED:  NONE  SPECIMEN:  No Specimen  DISPOSITION OF SPECIMEN:  N/A  COUNTS:  YES  TOURNIQUET:  * No tourniquets in log *  DICTATION: .Other Dictation: Dictation Number [id number]  PLAN OF CARE: Admit to inpatient   PATIENT DISPOSITION:  PACU - hemodynamically stable.   Delay start of Pharmacological VTE agent (>24hrs) due to surgical blood loss or risk of bleeding: no

## 2015-03-19 NOTE — H&P (Signed)
TOTAL HIP ADMISSION H&P  Patient is admitted for left total hip arthroplasty.  Subjective:  Chief Complaint: left hip pain  HPI: [name] [name], 82 y.o. female, has a history of pain and functional disability in the left hip(s) due to arthritis and patient has failed non-surgical conservative treatments for greater than 12 weeks to include NSAID's and/or analgesics, use of assistive devices and activity modification.  Onset of symptoms was gradual starting 3 years ago with gradually worsening course since that time.The patient noted no prior surgeries on the left hip(s).  Patient currently rates pain in the left hip at 10 out of 10 with activity. Patient has night pain, worsening of pain with activity and weight bearing, trendelenberg gait, pain that interfers with activities of daily living, pain with passive range of motion and crepitus. Patient has evidence of subchondral cysts, subchondral sclerosis, periarticular osteophytes and joint space narrowing by imaging studies. This condition presents safety issues increasing the risk of falls.  There is no current active infection.  Patient Active Problem List   Diagnosis Date Noted  . Osteoarthritis of left hip 03/19/2015  . S/P AVR 10/25/2013  . Ascending aorta dilatation (HCC)   . Hypertension   . Diabetes (HCC)   . Aortic stenosis   . Dyslipidemia    Past Medical History  Diagnosis Date  . Ascending aorta dilatation (HCC)   . Hypertension   . Diabetes (HCC)   . Aortic stenosis   . Dyslipidemia   . Pneumonia     2-3 YRS AGO     Past Surgical History  Procedure Laterality Date  . Aortic valve replacement  02/13/1999    Dr. Sharene ButtersP Van Trigt  . Appendectomy    . Knee arthroplasty      BILAT  . Cholecystectomy    . Tonsillectomy    . Cataract extraction w/ intraocular lens  implant, bilateral      Prescriptions prior to admission  Medication Sig Dispense Refill Last Dose  . aspirin 81 MG tablet Take 81 mg by mouth daily.    03/18/2015  at Unknown time  . docusate sodium (COLACE) 100 MG capsule Take 100 mg by mouth 2 (two) times daily.   03/18/2015 at Unknown time  . donepezil (ARICEPT) 10 MG tablet Take 10 mg by mouth at bedtime.    03/18/2015 at Unknown time  . fluticasone (FLONASE) 50 MCG/ACT nasal spray Place 2 sprays into the nose daily as needed for allergies.    Past Week at Unknown time  . folic acid (FOLVITE) 400 MCG tablet Take 400 mcg by mouth daily.   Past Week at Unknown time  . losartan-hydrochlorothiazide (HYZAAR) 100-25 MG per tablet Take 1 tablet by mouth daily.   03/18/2015 at Unknown time  . multivitamin (THERAGRAN) per tablet Take 1 tablet by mouth daily.   Past Week at Unknown time  . nortriptyline (PAMELOR) 10 MG capsule Take 10 mg by mouth at bedtime.   03/18/2015 at Unknown time  . repaglinide (PRANDIN) 0.5 MG tablet Take 0.5 mg by mouth 2 (two) times daily before a meal.   03/18/2015 at Unknown time   Allergies  Allergen Reactions  . Latex Rash    SKIN BECOMES RAW  . Sulfa Antibiotics Rash    Social History  Substance Use Topics  . Smoking status: Never Smoker   . Smokeless tobacco: Never Used  . Alcohol Use: No    History reviewed. No pertinent family history.   Review of Systems  Musculoskeletal:  Positive for joint pain.  All other systems reviewed and are negative.   Objective:  Physical Exam  Constitutional: She is oriented to person, place, and time. She appears well-developed and well-nourished.  HENT:  Head: Normocephalic and atraumatic.  Eyes: EOM are normal. Pupils are equal, round, and reactive to light.  Neck: Normal range of motion. Neck supple.  Cardiovascular: Normal rate and regular rhythm.   Respiratory: Effort normal and breath sounds normal.  GI: Soft. Bowel sounds are normal.  Musculoskeletal:       Left hip: She exhibits decreased range of motion, decreased strength, tenderness and bony tenderness.  Neurological: She is alert and oriented to person, place, and time.   Skin: Skin is warm and dry.  Psychiatric: She has a normal mood and affect.    Vital signs in last 24 hours: Temp:  [97.6 F (36.4 C)] 97.6 F (36.4 C) (01/10 1110) Pulse Rate:  [74] 74 (01/10 1110) Resp:  [20] 20 (01/10 1110) BP: (187)/(58) 187/58 mmHg (01/10 1110) SpO2:  [98 %] 98 % (01/10 1110) Weight:  [67.132 kg (148 lb)] 67.132 kg (148 lb) (01/10 1111)  Labs:   Estimated body mass index is 26.62 kg/(m^2) as calculated from the following:   Height as of this encounter: 5' 2.5" (1.588 m).   Weight as of this encounter: 67.132 kg (148 lb).   Imaging Review Plain radiographs demonstrate severe degenerative joint disease of the left hip(s). The bone quality appears to be fair for age and reported activity level.  Assessment/Plan:  End stage arthritis, left hip(s)  The patient history, physical examination, clinical judgement of the provider and imaging studies are consistent with end stage degenerative joint disease of the left hip(s) and total hip arthroplasty is deemed medically necessary. The treatment options including medical management, injection therapy, arthroscopy and arthroplasty were discussed at length. The risks and benefits of total hip arthroplasty were presented and reviewed. The risks due to aseptic loosening, infection, stiffness, dislocation/subluxation,  thromboembolic complications and other imponderables were discussed.  The patient acknowledged the explanation, agreed to proceed with the plan and consent was signed. Patient is being admitted for inpatient treatment for surgery, pain control, PT, OT, prophylactic antibiotics, VTE prophylaxis, progressive ambulation and ADL's and discharge planning.The patient is planning to be discharged to skilled nursing facility

## 2015-03-19 NOTE — Transfer of Care (Signed)
Immediate Anesthesia Transfer of Care Note  Patient: [name] [name]  Procedure(s) Performed: Procedure(s): LEFT TOTAL HIP ARTHROPLASTY ANTERIOR APPROACH (Left)  Patient Location: PACU  Anesthesia Type:Spinal  Level of Consciousness: awake, alert , oriented and patient cooperative  Airway & Oxygen Therapy: Patient Spontanous Breathing and Patient connected to face mask oxygen  Post-op Assessment: Report given to RN and Post -op Vital signs reviewed and stable  Post vital signs: Reviewed and stable  Last Vitals:  Filed Vitals:   03/19/15 1110  BP: 187/58  Pulse: 74  Temp: 36.4 C  Resp: 20    Complications: No apparent anesthesia complications

## 2015-03-19 NOTE — Anesthesia Procedure Notes (Addendum)
Spinal Patient location during procedure: OR Start time: 03/19/2015 12:38 PM End time: 03/19/2015 12:50 PM Staffing Anesthesiologist: Alexis Frock Preanesthetic Checklist Completed: patient identified, site marked, surgical consent, pre-op evaluation, timeout performed, IV checked, risks and benefits discussed and monitors and equipment checked Spinal Block Patient position: sitting Prep: ChloraPrep Patient monitoring: heart rate, continuous pulse ox and blood pressure Approach: midline Location: L4-5 Injection technique: single-shot Needle Needle type: Whitacre and Introducer  Needle gauge: 24 G Needle length: 9 cm Needle insertion depth: 6 cm Assessment Sensory level: T8 Additional Notes Negative paresthesia. Negative blood return. Positive free-flowing CSF. Expiration date of kit checked and confirmed. Patient tolerated procedure well, without complications.    Procedure Name: MAC Date/Time: 03/19/2015 1:14 PM Performed by: Rogers Blocker Pre-anesthesia Checklist: Patient identified, Emergency Drugs available, Suction available, Patient being monitored and Timeout performed Patient Re-evaluated:Patient Re-evaluated prior to inductionOxygen Delivery Method: Simple face mask Ventilation: Oral airway inserted - appropriate to patient size

## 2015-03-19 NOTE — Anesthesia Postprocedure Evaluation (Signed)
Anesthesia Post Note  Patient: [name] [name]  Procedure(s) Performed: Procedure(s) (LRB): LEFT TOTAL HIP ARTHROPLASTY ANTERIOR APPROACH (Left)  Patient location during evaluation: PACU Anesthesia Type: Spinal Level of consciousness: awake and alert and oriented Pain management: pain level controlled Vital Signs Assessment: post-procedure vital signs reviewed and stable Respiratory status: spontaneous breathing, nonlabored ventilation, respiratory function stable and patient connected to nasal cannula oxygen Cardiovascular status: blood pressure returned to baseline and stable Postop Assessment: no headache, no backache, spinal receding, patient able to bend at knees and no signs of nausea or vomiting Anesthetic complications: no    Last Vitals:  Filed Vitals:   03/19/15 1515 03/19/15 1524  BP: 114/44   Pulse: 71 70  Temp:    Resp: 19 16    Last Pain:  Filed Vitals:   03/19/15 1525  PainSc: 0-No pain    LLE Motor Response: Purposeful movement (wiggles toes) (03/19/15 1524) LLE Sensation: Decreased (due to pre-op spinal) (03/19/15 1524) RLE Motor Response: Purposeful movement (wiggles toes) (03/19/15 1524) RLE Sensation: Decreased (due to pre-op spinal) (03/19/15 1524) L Sensory Level: S1-Sole of foot, small toes (03/19/15 1524) R Sensory Level: S1-Sole of foot, small toes (03/19/15 1524)  [name]

## 2015-03-19 NOTE — Progress Notes (Signed)
Utilization review completed.  

## 2015-03-20 ENCOUNTER — Encounter (HOSPITAL_COMMUNITY): Payer: Self-pay | Admitting: Orthopaedic Surgery

## 2015-03-20 MED ORDER — SENNA 8.6 MG PO TABS
2.0000 | ORAL_TABLET | Freq: Every day | ORAL | Status: DC
  Administered 2015-03-20 – 2015-03-22 (×3): 17.2 mg via ORAL
  Filled 2015-03-20 (×3): qty 2

## 2015-03-20 MED ORDER — BISACODYL 10 MG RE SUPP
10.0000 mg | Freq: Every day | RECTAL | Status: DC | PRN
  Administered 2015-03-21: 10 mg via RECTAL
  Filled 2015-03-20: qty 1

## 2015-03-20 MED ORDER — FOLIC ACID 1 MG PO TABS
1.0000 mg | ORAL_TABLET | Freq: Every day | ORAL | Status: DC
  Administered 2015-03-20 – 2015-03-22 (×3): 1 mg via ORAL
  Filled 2015-03-20 (×3): qty 1

## 2015-03-20 NOTE — Progress Notes (Signed)
Subjective: 1 Day Post-Op Procedure(s) (LRB): LEFT TOTAL HIP ARTHROPLASTY ANTERIOR APPROACH (Left) Patient reports pain as moderate.    Objective: Vital signs in last 24 hours: Temp:  [97.2 F (36.2 C)-98.6 F (37 C)] 98.2 F (36.8 C) (01/11 0523) Pulse Rate:  [56-103] 103 (01/11 0523) Resp:  [13-22] 16 (01/11 0523) BP: (90-187)/(32-80) 120/80 mmHg (01/11 0523) SpO2:  [94 %-100 %] 99 % (01/11 0523) Weight:  [67.132 kg (148 lb)] 67.132 kg (148 lb) (01/10 1111)  Intake/Output from previous day: 01/10 0701 - 01/11 0700 In: 1573.8 [P.O.:240; I.V.:1283.8; IV Piggyback:50] Out: 1100 [Urine:900; Blood:200] Intake/Output this shift:    No results for input(s): HGB in the last 72 hours. No results for input(s): WBC, RBC, HCT, PLT in the last 72 hours. No results for input(s): NA, K, CL, CO2, BUN, CREATININE, GLUCOSE, CALCIUM in the last 72 hours. No results for input(s): LABPT, INR in the last 72 hours.  Sensation intact distally Intact pulses distally Dorsiflexion/Plantar flexion intact Incision: dressing C/D/I  Assessment/Plan: 1 Day Post-Op Procedure(s) (LRB): LEFT TOTAL HIP ARTHROPLASTY ANTERIOR APPROACH (Left) Up with therapy  Kimori Tartaglia Y 03/20/2015, 7:40 AM

## 2015-03-20 NOTE — Clinical Social Work Note (Signed)
CSW received referral for SNF.  Case discussed with case manager, and plan is to discharge home.  CSW to sign off please re-consult if social work needs arise.  Camila Norville R. Leoncio Hansen, MSW, LCSWA [phone]  

## 2015-03-20 NOTE — Op Note (Signed)
NAMJeanette [name]:  Eugene, Jamar                  ACCOUNT NO.:  [id number]  MEDICAL RECORD NO.:  [id number]  LOCATION:  [id number]                        FACILITY:  [facility]  PHYSICIAN:  Vanita PandaChristopher Y. Magnus IvanBlackman, M.D.DATE OF BIRTH:  Aug 14, 1926  DATE OF PROCEDURE:  03/19/2015 DATE OF DISCHARGE:  03/19/2015                              OPERATIVE REPORT   PREOPERATIVE DIAGNOSIS:  Primary osteoarthritis and degenerative joint disease, left hip.  POSTOPERATIVE DIAGNOSIS:  Primary osteoarthritis and degenerative joint disease, left hip.  PROCEDURE:  Left total hip arthroplasty direct anterior approach.  IMPLANTS:  DePuy Sector Gription acetabular component, size 50 with a single screw, size 32+ 0 neutral polyethylene liner, size 13 Corail femoral component with varus offset, size 32+ 1 ceramic hip ball.  SURGEON:  Vanita PandaChristopher Y. Magnus IvanBlackman, MD  ASSISTANT:  Richardean CanalGilbert Clark, PA-C  ANESTHESIA:  Spinal.  ANTIBIOTICS:  2 g IV Ancef.  BLOOD LOSS:  200 mL.  COMPLICATIONS:  None.  INDICATIONS:  Ms. [name] [name] is a very pleasant 82 year old female with known debilitating arthritis involving her left hip.  She shows signs of protrusio in that hip.  Her x-rays look awful with the complete loss of the joint space, shortening of her leg, and sclerotic changes.  This has detrimentally affected her activities of daily living and her quality of life, and her mobility to the point she does wish to proceed with a total hip arthroplasty.  She has had bilateral total knees in the past as well as heart and valve surgery and she is a TEFL teacherJehovah's Witnesses and declines any blood products and she understands the risk of acute blood loss anemia, nerve and vessel injury, fracture, infection, dislocation, DVT, and she understands the risks of acute blood loss anemia mainly we respect her right to Je[employer] and her faith that no blood products will be obtained, we can give her tranexamic acid at the time of incision.  She  understands our goal is to reduce pain, improve mobility, and overall improve quality of life.  PROCEDURE DESCRIPTION:  After informed consent was obtained, appropriate left hip was marked.  She was brought to the operating room and spinal anesthesia was obtained while she was on her stretcher.  She was then placed in a supine position on the stretcher.  Foley catheter was placed and both feet had traction boots applied to them.  She was placed supine on the Hana fracture table with the perineal post in place and both legs in inline skeletal traction devices, but no traction applied.  Her left operative hip was then prepped and draped with DuraPrep and sterile drapes.  Time-out was called to identify correct patient, correct left hip.  I then made an incision inferior and posterior to the anterosuperior iliac spine and carried this obliquely down the leg.  I dissected down the tensor fascia lata muscle and tensor fascia was then divided longitudinally, so we could proceed with a direct anterior approach to the hip.  We identified and cauterized lateral femoral circumflex vessels and then identified the femoral neck, opened up the hip capsule finding a large joint effusion.  We found significant disease of her capsule and the head being bearing too  much weight while performing a capsulectomy.  We placed Cobra retractors in the medial and lateral femoral neck and then made our femoral neck cut with an oscillating saw and completed this on osteotome.  We placed a corkscrew guide in the femoral head and removed the femoral head and had certainly complete loss of cartilage.  There were severe sclerotic changes and evidence this had protruded the ulnar acetabular side.  We then began reaming from a size 42 reamer meticulously and slowly up to a size 50 with all reamers under direct visualization and last reamer also under direct fluoroscopy, so we could obtain our depth of reaming,  our inclination, and anteversion.  Once we were pleased with this, we placed the real DePuy Sector Gription acetabular component size 50, in a single screw.  We then placed a 32+ 0 neutral polyethylene liner for that side of acetabular component.  Attention was then turned to the femur.  With the leg externally rotated to 100 degrees extended and adducted, we were able to place a Mueller retractor medially and a Hohmann retractor behind the greater trochanter, releasing the lateral joint capsule.  We then began broaching from a size 8 broach up to a size 13, due to her thin bone.  With a 13, we trialed a varus offset femoral neck where she had been living so short for so long, we needed gaining length, we would need to increase her offset as well and it would be difficult reduction. We trialed a 32+ 1 hip ball and brought leg back over and up with traction and internal rotation, reducing the pelvis and we were pleased with stability, leg length, and offset.  We then dislocated the hip and removed the trial components.  We then placed the real Corail femoral component with varus offset size 13 and a real 32+ 1 ceramic hip ball. We reduced this back in the acetabulum and it was stable.  We then copiously irrigated the soft tissues with normal saline solution using pulsatile lavage.  There was no joint capsule closed.  We closed the iliotibial band with interrupted #1 Vicryl suture followed by 0 Vicryl in the deep tissue, 2-0 Vicryl in subcutaneous tissues, interrupted staples on the skin.  Xeroform and Aquacel dressing was applied.  She was taken off the Hana table and taken to the recovery room in stable condition.  All final counts were correct.  There were no complications noted.  Of note, Richardean Canal, PA-C assisted in entire case.  His assistance was crucial for facilitating all aspects of this case.     Vanita Panda. Magnus Ivan, M.D.     CYB/MEDQ  D:  03/19/2015  T:   03/20/2015  Job:  [id number]

## 2015-03-20 NOTE — Evaluation (Signed)
Physical Therapy Evaluation Patient Details Name: [name] [name] MRN: [id number] DOB: 11-19-1926 Today's Date: 03/20/2015   History of Present Illness  Admitted for L THA; WBAT;  has a past medical history of Ascending aorta dilatation (HCC); Hypertension; Diabetes (HCC); Aortic stenosis; Dyslipidemia; and Pneumonia.  has past surgical history that includes Aortic valve replacement (02/13/1999); Appendectomy; Knee Arthroplasty  Clinical Impression   Pt is s/p THA resulting in the deficits listed below (see PT Problem List).  Pt will benefit from skilled PT to increase their independence and safety with mobility to allow discharge to the venue listed below.      Follow Up Recommendations Home health PT;Supervision/Assistance - 24 hour    Equipment Recommendations  Rolling walker with 5" wheels;3in1 (PT)    Recommendations for Other Services OT consult     Precautions / Restrictions Precautions Precautions: Fall Restrictions LLE Weight Bearing: Weight bearing as tolerated      Mobility  Bed Mobility Overal bed mobility: Needs Assistance Bed Mobility: Supine to Sit     Supine to sit: Mod assist;Min assist     General bed mobility comments: min assist to pull to sit; cues for technique; mod assist and support to help pt reciprocally scoot hip sto EOB  Transfers Overall transfer level: Needs assistance Equipment used: Rolling walker (2 wheeled) Transfers: Sit to/from Stand Sit to Stand: Min assist         General transfer comment: minsteady assist for sit<>stand, including on and off BSC; Cues for hand placement, safety, positioning of RW  Ambulation/Gait Ambulation/Gait assistance: Min assist;Mod assist Ambulation Distance (Feet): 20 Feet Assistive device: Rolling walker (2 wheeled) Gait Pattern/deviations: Step-to pattern;Trunk flexed;Decreased stance time - left;Decreased step length - right     General Gait Details: Cues for gait sequence and to keep RW close;  occasional need for mod assist to manage RW and keep it close for best support  Stairs            Wheelchair Mobility    Modified Rankin (Stroke Patients Only)       Balance Overall balance assessment: Needs assistance           Standing balance-Leahy Scale: Poor (approaching Fair)                               Pertinent Vitals/Pain Pain Assessment: 0-10 Pain Score: 5  Pain Location: L hip Pain Descriptors / Indicators: Aching Pain Intervention(s): Limited activity within patient's tolerance;Monitored during session;Repositioned    Home Living Family/patient expects to be discharged to:: Private residence   Available Help at Discharge: Family;Friend(s);Available 24 hours/day (initially) Type of Home: House Home Access: Stairs to enter Entrance Stairs-Rails: None Entrance Stairs-Number of Steps: 2 (+1) Home Layout: One level Home Equipment:  (to be determined)      Prior Function Level of Independence: Independent         Comments: may have used an assistive device     Hand Dominance        Extremity/Trunk Assessment   Upper Extremity Assessment: Defer to OT evaluation           Lower Extremity Assessment: LLE deficits/detail   LLE Deficits / Details: Grossly decr AROM and strength L hip limited by pain postop     Communication   Communication: No difficulties  Cognition Arousal/Alertness: Awake/alert Behavior During Therapy: WFL for tasks assessed/performed Overall Cognitive Status: Within Functional Limits for tasks assessed  General Comments      Exercises Total Joint Exercises Ankle Circles/Pumps: AROM;Both;10 reps Quad Sets: AROM;Both;10 reps Heel Slides: AAROM;Left;5 reps Hip ABduction/ADduction: AAROM;Left (2)      Assessment/Plan    PT Assessment Patient needs continued PT services  PT Diagnosis Difficulty walking;Acute pain   PT Problem List Decreased strength;Decreased  range of motion;Decreased activity tolerance;Decreased balance;Decreased mobility;Decreased knowledge of use of DME;Decreased safety awareness;Decreased knowledge of precautions;Pain  PT Treatment Interventions DME instruction;Gait training;Stair training;Functional mobility training;Therapeutic activities;Therapeutic exercise;Patient/family education   PT Goals (Current goals can be found in the Care Plan section) Acute Rehab PT Goals Patient Stated Goal: immediate goal of going to bathroom PT Goal Formulation: With patient Time For Goal Achievement: 03/27/15 Potential to Achieve Goals: Good    Frequency 7X/week   Barriers to discharge        Co-evaluation               End of Session Equipment Utilized During Treatment: Gait belt Activity Tolerance: Patient tolerated treatment well Patient left: in chair;with call bell/phone within reach;with family/visitor present Nurse Communication: Mobility status         Time: [id number] PT Time Calculation (min) (ACUTE ONLY): 32 min   Charges:   PT Evaluation $PT Eval Moderate Complexity: 1 Procedure PT Treatments $Gait Training: 8-22 mins   PT G Codes:        Olen PelGarrigan, Kiven Vangilder Hamff 03/20/2015, 9:28 AM  Van ClinesHolly Louis Gaw, PT  Acute Rehabilitation Services Pager [phone] Office [phone]

## 2015-03-20 NOTE — Progress Notes (Signed)
Physical Therapy Treatment Patient Details Name: [name] [name] MRN: [id number] DOB: 1926-11-21 Today's Date: 03/20/2015    History of Present Illness Admitted for L THA; WBAT;  has a past medical history of Ascending aorta dilatation (HCC); Hypertension; Diabetes (HCC); Aortic stenosis; Dyslipidemia; and Pneumonia.  has past surgical history that includes Aortic valve replacement (02/13/1999); Appendectomy; Knee Arthroplasty    PT Comments    Patent progressing well with PT. Patient needs to practice stairs before d/c. Current plan remains appropriate.    Follow Up Recommendations  Home health PT;Supervision/Assistance - 24 hour     Equipment Recommendations  Rolling walker with 5" wheels;3in1 (PT)    Recommendations for Other Services OT consult     Precautions / Restrictions Precautions Precautions: Fall Restrictions Weight Bearing Restrictions: Yes LLE Weight Bearing: Weight bearing as tolerated    Mobility  Bed Mobility                  Transfers Overall transfer level: Needs assistance Equipment used: Rolling walker (2 wheeled) Transfers: Sit to/from Stand Sit to Stand: Min assist         General transfer comment: min A to power up into standing; vc for hand placement and position of RW upon standing  Ambulation/Gait Ambulation/Gait assistance: Min guard Ambulation Distance (Feet): 80 Feet Assistive device: Rolling walker (2 wheeled) Gait Pattern/deviations: Step-to pattern;Antalgic;Trunk flexed;Decreased stance time - left;Decreased step length - right     General Gait Details: cues for sequencing, maintaining upright posture, and postition of RW; verbal and visual cues for facilitation of L heel strike    Stairs            Wheelchair Mobility    Modified Rankin (Stroke Patients Only)       Balance Overall balance assessment: Needs assistance Sitting-balance support: Feet supported Sitting balance-Leahy Scale: Good     Standing  balance support: Bilateral upper extremity supported Standing balance-Leahy Scale: Fair                      Cognition Arousal/Alertness: Awake/alert Behavior During Therapy: WFL for tasks assessed/performed Overall Cognitive Status: Within Functional Limits for tasks assessed                      Exercises Total Joint Exercises Quad Sets: AROM;Both;10 reps Heel Slides: AAROM;Left;10 reps;Seated    General Comments General comments (skin integrity, edema, etc.): Pt reported abdominal discomfort and had 3  brown pills that she reported are "vegetable pills" that she wants to take to help go to the bathroom. therapist advised pt to wait for RN to come in to administer pain medication to ensure the pills are safe to take with other medications. Pt reported not having the bottle and could not remember the name so RN advised pt not to take the pills and told pt she could give her miralax instead. Pt decided to take pills against the advise of RN and therapist.       Pertinent Vitals/Pain Pain Assessment: 0-10 Pain Score: 5  Pain Location: L hip Pain Descriptors / Indicators: Sore Pain Intervention(s): Limited activity within patient's tolerance;Monitored during session;RN gave pain meds during session;Ice applied    Home Living                      Prior Function            PT Goals (current goals can now be found in the care  plan section) Acute Rehab PT Goals Patient Stated Goal: go home PT Goal Formulation: With patient Time For Goal Achievement: 03/27/15 Potential to Achieve Goals: Good Progress towards PT goals: Progressing toward goals    Frequency  7X/week    PT Plan Current plan remains appropriate    Co-evaluation             End of Session Equipment Utilized During Treatment: Gait belt Activity Tolerance: Patient tolerated treatment well Patient left: in chair;with call bell/phone within reach     Time: 1421-1500 PT Time  Calculation (min) (ACUTE ONLY): 39 min  Charges:  $Gait Training: 23-37 mins $Therapeutic Exercise: 8-22 mins                    G Codes:      Derek MoundKellyn R Maurina Fawaz Harneet Noblett, PTA Pager: [phone]   03/20/2015, 4:28 PM

## 2015-03-20 NOTE — Progress Notes (Signed)
Called into the patient's room to administer pain medicine. PT in room. PT notified this nurse writer that the patient has some type of medication in her possession. 3 brown capsules noted on the bedside table. This nurse writer asked the patient is she could see the pill bottle. Patient stated that she does not have the pill bottle on hand but the pill were [facility] that help with const[facility]. I don't remember the name". This nurse writer explained to the patient that without the bottle, pharmacy can not approve with medication. We are unable to identify the medication and any possible drug interactions. This nurse writer advised the patient not to take the capsules and offered her Miralax instead. She said "I'll take the Miralax and these pills". This Production managernurse writer again advised the patient not to take the medication. If the Miralax is not effective, I can call the doctor to get orders for something else. We just want to make sure that you are safe. Herbs can have adverse affects when taken with other medicines. This nurse writer left the room, informed the charge nurse about the situation. The charge nurse called Dr. Magnus IvanBlackman who said to advise the patient not to take the medicine and that he would be up on the floor in about two hours. When this nurse writer went to inform the patient about what the doctor said, she had already taken the pills and stated "well, I needed to do something". Will continue to monitor and assist patient.

## 2015-03-21 LAB — GLUCOSE, CAPILLARY
Glucose-Capillary: 88 mg/dL (ref 65–99)
Glucose-Capillary: 84 mg/dL (ref 65–99)

## 2015-03-21 MED ORDER — ASPIRIN 325 MG PO TBEC: 325.0000 mg | DELAYED_RELEASE_TABLET | Freq: Every day | ORAL | Status: AC

## 2015-03-21 MED ORDER — METHOCARBAMOL 500 MG PO TABS: 500.0000 mg | ORAL_TABLET | Freq: Four times a day (QID) | ORAL | Status: AC | PRN

## 2015-03-21 MED ORDER — HYDROCODONE-ACETAMINOPHEN 5-325 MG PO TABS: 1.0000 | ORAL_TABLET | ORAL | Status: AC | PRN

## 2015-03-21 NOTE — Discharge Instructions (Signed)

## 2015-03-21 NOTE — Progress Notes (Signed)
Physical Therapy Treatment Patient Details Name: [name] EchevariaLena F [name] MRN: [id number] DOB: Oct 30, 1926 Today's Date: 03/21/2015    History of Present Illness Admitted for L THA; WBAT;  has a past medical history of Ascending aorta dilatation (HCC); Hypertension; Diabetes (HCC); Aortic stenosis; Dyslipidemia; and Pneumonia.  has past surgical history that includes Aortic valve replacement (02/13/1999); Appendectomy; Knee Arthroplasty    PT Comments    Patient with increased difficulty with mobility this session. Patient lives alone and does not have adequate support to safely discharge home due to pt's current mobility level and high risk of falls. Recommending skilled nursing facility for post-acute therapy needs.   Follow Up Recommendations  Supervision/Assistance - 24 hour;SNF     Equipment Recommendations  Rolling walker with 5" wheels;3in1 (PT)    Recommendations for Other Services OT consult     Precautions / Restrictions Precautions Precautions: Fall Restrictions Weight Bearing Restrictions: Yes LLE Weight Bearing: Weight bearing as tolerated    Mobility  Bed Mobility               General bed mobility comments: Pt upin chair upon arrival  Transfers Overall transfer level: Needs assistance Equipment used: Rolling walker (2 wheeled) Transfers: Sit to/from Stand Sit to Stand: Min assist         General transfer comment: min A for powering up into standing with increased time needed for pt to get bilat hands onto RW and achieve upright posture  Ambulation/Gait Ambulation/Gait assistance: Min guard;Min assist Ambulation Distance (Feet): 50 Feet Assistive device: Rolling walker (2 wheeled) Gait Pattern/deviations: Step-to pattern;Decreased step length - right;Decreased stance time - left;Antalgic;Trunk flexed;Decreased dorsiflexion - left     General Gait Details: pt with difficulty initiating swing phase and reported that L leg feels heavy; pt with decreased L step  height; vc for position of RW, sequencing, and maintaining upright posture; encouraged pt to increase WS to L LE; Pt with decreased dorsiflexion on L LE and c/o L ankle pain; pt reported previous accident and injury to L ankle years ago    Information systems managertairs            Wheelchair Mobility    Modified Rankin (Stroke Patients Only)       Balance Overall balance assessment: Needs assistance Sitting-balance support: No upper extremity supported Sitting balance-Leahy Scale: Good     Standing balance support: Bilateral upper extremity supported Standing balance-Leahy Scale: Poor                      Cognition Arousal/Alertness: Awake/alert Behavior During Therapy: WFL for tasks assessed/performed Overall Cognitive Status: Within Functional Limits for tasks assessed                      Exercises Total Joint Exercises Quad Sets: AROM;Both;10 reps Heel Slides: AAROM;Left;10 reps;Seated Hip ABduction/ADduction: AAROM;Left;10 reps;Seated    General Comments        Pertinent Vitals/Pain Pain Assessment: Faces Faces Pain Scale: Hurts little more (with movement) Pain Location: L hip Pain Descriptors / Indicators: Sore Pain Intervention(s): Limited activity within patient's tolerance;Monitored during session;Premedicated before session;Patient requesting pain meds-RN notified;Ice applied    Home Living                      Prior Function            PT Goals (current goals can now be found in the care plan section) Acute Rehab PT Goals Patient Stated Goal: go  home PT Goal Formulation: With patient Time For Goal Achievement: 03/27/15 Potential to Achieve Goals: Good Progress towards PT goals: Progressing toward goals    Frequency  7X/week    PT Plan Discharge plan needs to be updated    Co-evaluation             End of Session Equipment Utilized During Treatment: Gait belt Activity Tolerance: Patient tolerated treatment well Patient left:  in chair;with call bell/phone within reach;with family/visitor present     Time: 1610-9604 PT Time Calculation (min) (ACUTE ONLY): 30 min  Charges:  $Gait Training: 8-22 mins $Therapeutic Exercise: 8-22 mins                    G Codes:      Derek Mound, PTA Pager: [phone]   03/21/2015, 1:17 PM

## 2015-03-21 NOTE — Progress Notes (Signed)
Occupational Therapy Evaluation Patient Details Name: [name] [name] MRN: [id number] DOB: May 13, 1926 Today's Date: 03/21/2015    History of Present Illness Admitted for L THA; WBAT;  has a past medical history of Ascending aorta dilatation (HCC); Hypertension; Diabetes (HCC); Aortic stenosis; Dyslipidemia; and Pneumonia.  has past surgical history that includes Aortic valve replacement (02/13/1999); Appendectomy; Knee Arthroplasty   Clinical Impression   PTA, pt was independent with all ADLs and used RW/SPC for mobility. Pt currently requires min-mod assist with functional mobility and min-max assist with ADLs. At sink, pt's LLE buckled and pt began to fall backwards towards floor and required max assist from therapist to catch and regain balance. Pt is a HIGH fall risk and daughter-in-law reported to therapist that pt does not have guaranteed assistance after she leaves on Sunday from friend who also cannot provide any physical assistance. Discussed with pt and daughter-in-law about going to SNF for short-term rehab stay before going home as this was the safest option for everyone and pt agreed. CM and CSW informed of this change in d/c plan.     Follow Up Recommendations  SNF;Supervision/Assistance - 24 hour    Equipment Recommendations  3 in 1 bedside comode;Tub/shower seat    Recommendations for Other Services       Precautions / Restrictions Precautions Precautions: Fall Restrictions Weight Bearing Restrictions: Yes LLE Weight Bearing: Weight bearing as tolerated      Mobility Bed Mobility               General bed mobility comments: Pt on BSC on OT arrival  Transfers Overall transfer level: Needs assistance Equipment used: Rolling walker (2 wheeled) Transfers: Sit to/from Stand Sit to Stand: Mod assist         General transfer comment: Mod assist for boost to stand and to stabilize RW. Pt required verbal and tactile cues to take steps with LLE today due to  increased soreness in L hip. Verbal cues throughout session for safe hand placement and to not push RW too far in front of her.     Balance Overall balance assessment: Needs assistance Sitting-balance support: No upper extremity supported;Feet supported Sitting balance-Leahy Scale: Good     Standing balance support: Bilateral upper extremity supported;During functional activity Standing balance-Leahy Scale: Poor Standing balance comment: HIGH FALL RISK. At sink, pt's LLE "gave out" and pt began to fall backwards towards the floor and required mod-max assist from therapist to catch her and regain balance. Pt unable to tolerate static standing tasks for very long due to LLE buckling.                            ADL Overall ADL's : Needs assistance/impaired     Grooming: Wash/dry hands;Wash/dry face;Oral care;Brushing hair;Moderate assistance;Cueing for safety;Standing   Upper Body Bathing: Set up;Minimal assitance;Sitting   Lower Body Bathing: Moderate assistance;Cueing for safety;Sit to/from stand   Upper Body Dressing : Set up;Sitting   Lower Body Dressing: Maximal assistance;Sit to/from stand   Toilet Transfer: Moderate assistance;Ambulation;BSC;RW   Toileting- Clothing Manipulation and Hygiene: Minimal assistance;Sit to/from stand       Functional mobility during ADLs: Moderate assistance;Rolling walker General ADL Comments: At sink, pt's LLE "gave out" and pt began to fall backwards towards the floor and required mod-max assist from therapist to catch her and regain balance. Pt unable to tolerate static standing tasks for very long due to LLE buckling. Pt unable to reach LB  for bathing and dressing and will require AE training.      Vision Vision Assessment?: No apparent visual deficits   Perception     Praxis      Pertinent Vitals/Pain Pain Assessment: 0-10 Pain Score: 7  Pain Location: L hip Pain Descriptors / Indicators: Aching;Sore Pain  Intervention(s): Limited activity within patient's tolerance;Monitored during session;Repositioned;RN gave pain meds during session     Hand Dominance Right   Extremity/Trunk Assessment Upper Extremity Assessment Upper Extremity Assessment: Generalized weakness   Lower Extremity Assessment Lower Extremity Assessment: LLE deficits/detail LLE Deficits / Details: Grossly decr AROM and strength L hip limited by pain postop   Cervical / Trunk Assessment Cervical / Trunk Assessment: Kyphotic   Communication Communication Communication: No difficulties   Cognition Arousal/Alertness: Awake/alert Behavior During Therapy: WFL for tasks assessed/performed Overall Cognitive Status: Within Functional Limits for tasks assessed                     General Comments       Exercises       Shoulder Instructions      Home Living Family/patient expects to be discharged to:: Private residence Living Arrangements: Alone Available Help at Discharge: Family;Available 24 hours/day (until Sunday) Type of Home: House Home Access: Stairs to enter [employer] of Steps: 2 Entrance Stairs-Rails: None Home Layout: One level     Bathroom Shower/Tub: Walk-in shower;Door   [employer] Toilet: Standard     Home Equipment: Environmental consultant - 2 wheels;Walker - 4 wheels;Gilmer Mor - single point   Additional Comments: Daughter-in-law staying with pt until Sunday. Pt states that a friend in late 6's is coming on Sunday to assist as needed, but cannot provide any physical assistance. Daughter-in-law pulled OT aside and reported that family is not sure if this friend is actually coming at all to help so pt may be alone after Sunday      Prior Functioning/Environment Level of Independence: Independent with assistive device(s)        Comments: Used RW or SPC for mobility. Sponge bathed standing at sink and took shower 1x/week. Difficulty reaching LB for bathing and dressing but managed    OT  Diagnosis: Generalized weakness;Acute pain   OT Problem List: Decreased strength;Decreased range of motion;Impaired balance (sitting and/or standing);Decreased activity tolerance;Decreased coordination;Decreased safety awareness;Decreased knowledge of use of DME or AE;Decreased knowledge of precautions;Pain   OT Treatment/Interventions: Self-care/ADL training;Therapeutic exercise;Energy conservation;DME and/or AE instruction;Therapeutic activities;Patient/family education;Balance training    OT Goals(Current goals can be found in the care plan section) Acute Rehab OT Goals Patient Stated Goal: to go to rehab for 10 days and then go home OT Goal Formulation: With patient Time For Goal Achievement: 04/04/15 Potential to Achieve Goals: Good ADL Goals Pt Will Perform Grooming: with supervision;standing Pt Will Perform Lower Body Bathing: with supervision;with adaptive equipment;sit to/from stand Pt Will Perform Lower Body Dressing: with supervision;with adaptive equipment;sit to/from stand Pt Will Transfer to Toilet: with supervision;ambulating;bedside commode Pt Will Perform Toileting - Clothing Manipulation and hygiene: with supervision;sit to/from stand Pt Will Perform Tub/Shower Transfer: Shower transfer;with supervision;ambulating;shower seat;rolling walker  OT Frequency: Min 2X/week   Barriers to D/C: Decreased caregiver support  Unsure of assistance after Sunday. No family/friends can provide much/if any physical assistance       Co-evaluation              End of Session Equipment Utilized During Treatment: Gait belt;Rolling walker Nurse Communication: Mobility status;Other (comment) (check IV)  Activity Tolerance: Patient  limited by pain Patient left: in chair;with call bell/phone within reach;with family/visitor present   Time: [id number] OT Time Calculation (min): 31 min Charges:  OT General Charges $OT Visit: 1 Procedure OT Evaluation $OT Eval Moderate Complexity: 1  Procedure OT Treatments $Self Care/Home Management : 8-22 mins G-Codes:    Nils PyleJulia Bula [name], OTR/L Pager: [phone] 03/21/2015, 9:46 AM

## 2015-03-21 NOTE — Clinical Social Work Note (Signed)
CSW received referral that patient and her family would like to go to SNF for short term rehab.  CSW explained process to patient and her daughter in law about SNF searching.  Patient stated she would like to [facility] to [facility] [place] because it is close to wear she lives.  CSW contacted [facility] in [place] who said they can take patient once she has had a 3 night qualifying stay, medically ready for discharge, and orders have been received.  CSW continuing to follow patient's progress throughout discharge planning.  Ervin KnackEric R. Emmajane Altamura, MSW, Theresia MajorsLCSWA [phone] 03/21/2015 5:35 PM

## 2015-03-21 NOTE — Progress Notes (Signed)
Subjective: 2 Days Post-Op Procedure(s) (LRB): LEFT TOTAL HIP ARTHROPLASTY ANTERIOR APPROACH (Left) Patient reports pain as moderate.  Complaining only of constipation  Objective: Vital signs in last 24 hours: Temp:  [98.1 F (36.7 C)-98.5 F (36.9 C)] 98.5 F (36.9 C) (01/12 0500) Pulse Rate:  [79-99] 79 (01/12 0500) Resp:  [16-18] 17 (01/12 0500) BP: (128-136)/(47-63) 128/58 mmHg (01/12 0500) SpO2:  [96 %-100 %] 96 % (01/12 0500)  Intake/Output from previous day: 01/11 0701 - 01/12 0700 In: 690 [P.O.:690] Out: -  Intake/Output this shift:    No results for input(s): HGB in the last 72 hours. No results for input(s): WBC, RBC, HCT, PLT in the last 72 hours. No results for input(s): NA, K, CL, CO2, BUN, CREATININE, GLUCOSE, CALCIUM in the last 72 hours. No results for input(s): LABPT, INR in the last 72 hours.  Sensation intact distally Intact pulses distally Dorsiflexion/Plantar flexion intact Incision: scant drainage  Assessment/Plan: 2 Days Post-Op Procedure(s) (LRB): LEFT TOTAL HIP ARTHROPLASTY ANTERIOR APPROACH (Left) Up with therapy Plan for discharge tomorrow Discharge home with home health  Suppository prn  [name] [name] 03/21/2015, 8:59 AM

## 2015-03-21 NOTE — Clinical Social Work Placement (Addendum)
   CLINICAL SOCIAL WORK PLACEMENT  NOTE  Date:  03/21/2015  Patient Details  Name: [name] EchevariaLena F Mcmeans MRN: [id number] Date of Birth: 1926/10/10  Clinical Social Work is seeking post-discharge placement for this patient at the Skilled  Nursing Facility level of care (*CSW will initial, date and re-position this form in  chart as items are completed):  Yes   Patient/family provided with [facility] Clinical Social Work Department's list of facilities offering this level of care within the geographic area requested by the patient (or if unable, by the patient's family).  Yes   Patient/family informed of their freedom to choose among providers that offer the needed level of care, that participate in Medicare, Medicaid or managed care program needed by the patient, have an available bed and are willing to accept the patient.  Yes   Patient/family informed of [facility]'s ownership interest in [facility] and [facility], as well as of the fact that they are under no obligation to receive care at these facilities.  PASRR submitted to EDS on 03/21/15     PASRR number received on       Existing PASRR number confirmed on 03/21/15     FL2 transmitted to all facilities in geographic area requested by pt/family on 03/21/15     FL2 transmitted to all facilities within larger geographic area on       Patient informed that his/her managed care company has contracts with or will negotiate with certain facilities, including the following:        Yes   Patient/family informed of bed offers received.  Patient chooses bed at [facility] [place]     Physician recommends and patient chooses bed at      [facility] [place] on  03/22/15.  Patient to be transferred to facility by  [facility]     Patient family notified on  03/22/15 of transfer.  Name of family member notified:   Clement SayresJames Broxterman patient's son     PHYSICIAN       Additional Comment:     _______________________________________________ Darleene CleaverAnterhaus, Marvella Jenning R, LCSWA 03/21/2015, 5:45 PM

## 2015-03-21 NOTE — NC FL2 (Signed)
[place] MEDICAID FL2 LEVEL OF CARE SCREENING TOOL     IDENTIFICATION  Patient Name: [name] [name] Birthdate: [birth year] Sex: female Admission Date (Current Location): 03/19/2015  [facility] and [place] Number:      Facility and Address:  [facility], [address] [place], [place] [address]      Provider Number: [id number]  Attending Physician Name and Address:  Kathryne Hitch, *  Relative Name and Phone Number:  Marta Lamas. Princessa Lesmeister [phone] or [phone]    Current Level of Care: Hospital Recommended Level of Care: Skilled Nursing Facility Prior Approval Number:    Date Approved/Denied:   PASRR Number: [id number]  Discharge Plan: SNF    Current Diagnoses: Patient Active Problem List   Diagnosis Date Noted  . Osteoarthritis of left hip 03/19/2015  . Status post total replacement of left hip 03/19/2015  . S/P AVR 10/25/2013  . Ascending aorta dilatation (HCC)   . Hypertension   . Diabetes (HCC)   . Aortic stenosis   . Dyslipidemia     Orientation RESPIRATION BLADDER Height & Weight    Self, Time, Situation, Place  Normal Continent 5' 2.5" (158.8 cm) 148 lbs.  BEHAVIORAL SYMPTOMS/MOOD NEUROLOGICAL BOWEL NUTRITION STATUS      Continent    AMBULATORY STATUS COMMUNICATION OF NEEDS Skin   Limited Assist Verbally Surgical wounds                       Personal Care Assistance Level of Assistance  Bathing, Dressing Bathing Assistance: Limited assistance   Dressing Assistance: Limited assistance     Functional Limitations Info             SPECIAL CARE FACTORS FREQUENCY  PT (By licensed PT), OT (By licensed OT)     PT Frequency: 5x per week OT Frequency: 5x per week            Contractures      Additional Factors Info  Code Status, Allergies Code Status Info: Full Allergies Info: LATEX, SULFA ANTIBIOTICS           Current Medications (03/21/2015):  This is the current hospital active medication  list Current Facility-Administered Medications  Medication Dose Route Frequency Provider Last Rate Last Dose  . acetaminophen (TYLENOL) tablet 650 mg  650 mg Oral Q6H PRN Kathryne Hitch, MD       Or  . acetaminophen (TYLENOL) suppository 650 mg  650 mg Rectal Q6H PRN Kathryne Hitch, MD      . aspirin EC tablet 325 mg  325 mg Oral Q breakfast Kathryne Hitch, MD   325 mg at 03/21/15 0901  . bisacodyl (DULCOLAX) suppository 10 mg  10 mg Rectal Daily PRN Kathryne Hitch, MD      . diphenhydrAMINE (BENADRYL) 12.5 MG/5ML elixir 12.5-25 mg  12.5-25 mg Oral Q4H PRN Kathryne Hitch, MD      . donepezil (ARICEPT) tablet 10 mg  10 mg Oral QHS Kathryne Hitch, MD   10 mg at 03/20/15 2154  . ferrous sulfate tablet 325 mg  325 mg Oral TID PC Kathryne Hitch, MD   325 mg at 03/20/15 1754  . fluticasone (FLONASE) 50 MCG/ACT nasal spray 2 spray  2 spray Each Nare Daily PRN Kathryne Hitch, MD      . folic acid (FOLVITE) tablet 1 mg  1 mg Oral Daily Kathryne Hitch, MD   1 mg at 03/20/15  1149  . losartan (COZAAR) tablet 100 mg  100 mg Oral Daily Kathryne Hitchhristopher Y Blackman, MD   100 mg at 03/20/15 1148   And  . hydrochlorothiazide (HYDRODIURIL) tablet 25 mg  25 mg Oral Daily Kathryne Hitchhristopher Y Blackman, MD   25 mg at 03/20/15 1148  . HYDROmorphone (DILAUDID) injection 0.5 mg  0.5 mg Intravenous Q2H PRN Kathryne Hitchhristopher Y Blackman, MD      . menthol-cetylpyridinium (CEPACOL) lozenge 3 mg  1 lozenge Oral PRN Kathryne Hitchhristopher Y Blackman, MD       Or  . phenol (CHLORASEPTIC) mouth spray 1 spray  1 spray Mouth/Throat PRN Kathryne Hitchhristopher Y Blackman, MD      . methocarbamol (ROBAXIN) tablet 500 mg  500 mg Oral Q6H PRN Kathryne Hitchhristopher Y Blackman, MD       Or  . methocarbamol (ROBAXIN) 500 mg in dextrose 5 % 50 mL IVPB  500 mg Intravenous Q6H PRN Kathryne Hitchhristopher Y Blackman, MD      . metoCLOPramide (REGLAN) tablet 5-10 mg  5-10 mg Oral Q8H PRN Kathryne Hitchhristopher Y Blackman, MD       Or  .  metoCLOPramide (REGLAN) injection 5-10 mg  5-10 mg Intravenous Q8H PRN Kathryne Hitchhristopher Y Blackman, MD      . multivitamin with minerals tablet 1 tablet  1 tablet Oral Daily Kathryne Hitchhristopher Y Blackman, MD   1 tablet at 03/20/15 1148  . nortriptyline (PAMELOR) capsule 10 mg  10 mg Oral QHS Kathryne Hitchhristopher Y Blackman, MD   10 mg at 03/20/15 2155  . ondansetron (ZOFRAN) tablet 4 mg  4 mg Oral Q6H PRN Kathryne Hitchhristopher Y Blackman, MD       Or  . ondansetron [facility](ZOFRAN) injection 4 mg  4 mg Intravenous Q6H PRN Kathryne Hitchhristopher Y Blackman, MD      . oxyCODONE (Oxy IR/ROXICODONE) immediate release tablet 5-10 mg  5-10 mg Oral Q3H PRN Kathryne Hitchhristopher Y Blackman, MD   10 mg at 03/21/15 0902  . polyethylene glycol (MIRALAX / GLYCOLAX) packet 17 g  17 g Oral Daily PRN Kathryne Hitchhristopher Y Blackman, MD      . repaglinide (PRANDIN) tablet 0.5 mg  0.5 mg Oral BID AC Kathryne Hitchhristopher Y Blackman, MD   0.5 mg at 03/21/15 0902  . senna (SENOKOT) tablet 17.2 mg  2 tablet Oral Daily Kathryne Hitchhristopher Y Blackman, MD   17.2 mg at 03/20/15 2158  . zolpidem (AMBIEN) tablet 5 mg  5 mg Oral QHS PRN Kathryne Hitchhristopher Y Blackman, MD   5 mg at 03/20/15 2158     Discharge Medications: Please see discharge summary for a list of discharge medications.  Relevant Imaging Results:  Relevant Lab Results:   Additional Information    Sachiko Methot, Ervin Knackric R, LCSWA

## 2015-03-21 NOTE — Clinical Social Work Note (Signed)
Clinical Social Work Assessment  Patient Details  Name: [name] [name] MRN: [id number] Date of Birth: Jul 01, 1926  Date of referral:  03/21/15               Reason for consult:  Facility Placement                Permission sought to share information with:  Family Supports, Magazine features editoracility Contact Representative Permission granted to share information::  Yes, Verbal Permission Granted  Name::     Elmer SowJames O Franzel, Son [phone]  Agency::  SNF admissions  Relationship::     Contact Information:     Housing/Transportation Living arrangements for the past 2 months:  Single Family Home Source of Information:  Patient Patient Interpreter Needed:  None Criminal Activity/Legal Involvement Pertinent to Current Situation/Hospitalization:  No - Comment as needed Significant Relationships:  Adult Children Lives with:  Self Do you feel safe going back to the place where you live?  No (Patient feels she needs some short term rehab before she can return back home.) Need for family participation in patient care:  Yes (Comment) (Patient requests to have family help with decision making.)  Care giving concerns: Patient and family agreeable to going to SNF for short term rehab.   Social Worker assessment / plan:  Patient is a 82 year old female who lives alone in her own home.  Patient is alert and oriented x4 and able to make her own decisions.  Patient's daughter in law was at bedside and was asking questions about SNF placement for short term rehab.  CSW explained to patient and her family the process for SNF searching.  Patient and her daughter in law expressed that she has never been to SNF for short term rehab, CSW explained to patient what to expect and answered all relative questions.  Patient stated she feels comfortable discharging to SNF, and did not have any other questions.  Employment status:  Retired Health and safety inspectornsurance information:  Armed forces operational officerMedicare, Medicaid [place] PT Recommendations:  Skilled Nursing  Facility Information / Referral to community resources:  Skilled Nursing Facility  Patient/Family's Response to care:  Patient and family agreeable to going to SNF for short term rehab.    Patient/Family's Understanding of and Emotional Response to Diagnosis, Current Treatment, and Prognosis:  Patient and family aware of current treatment plan and diagnosis.  Emotional Assessment Appearance:  Appears stated age Attitude/Demeanor/Rapport:    Affect (typically observed):  Appropriate, Pleasant, Calm Orientation:  Oriented to Place, Oriented to Self, Oriented to  Time, Oriented to Situation Alcohol / Substance use:  Not Applicable Psych involvement (Current and /or in the community):  No (Comment)  Discharge Needs  Concerns to be addressed:  Lack of Support Readmission within the last 30 days:  No Current discharge risk:  Lack of support system Barriers to Discharge:  No Barriers Identified   [place] [employer], Angeline Trick R, LCSWA 03/21/2015, 5:39 PM

## 2015-03-21 NOTE — Progress Notes (Signed)
Physical Therapy Treatment Patient Details Name: [name] EchevariaLena F Furrow MRN: [id number] DOB: 1926-04-19 Today's Date: 03/21/2015    History of Present Illness Admitted for L THA; WBAT;  has a past medical history of Ascending aorta dilatation (HCC); Hypertension; Diabetes (HCC); Aortic stenosis; Dyslipidemia; and Pneumonia.  has past surgical history that includes Aortic valve replacement (02/13/1999); Appendectomy; Knee Arthroplasty    PT Comments    Patient with some improvement in mobility this session. Continue to progress as tolerated with anticipated d/c to SNF for further skilled PT services.    Follow Up Recommendations  Supervision/Assistance - 24 hour;SNF     Equipment Recommendations  Rolling walker with 5" wheels;3in1 (PT)    Recommendations for Other Services OT consult     Precautions / Restrictions Precautions Precautions: Fall Restrictions Weight Bearing Restrictions: Yes LLE Weight Bearing: Weight bearing as tolerated    Mobility  Bed Mobility Overal bed mobility: Needs Assistance Bed Mobility: Supine to Sit     Supine to sit: Min assist     General bed mobility comments: assistance to bring L LE to EOB and scoot hips to EOB when in sitting; No assist elevating trunk with vc for technique; HOB flat; min use of bedrails  Transfers Overall transfer level: Needs assistance Equipment used: Rolling walker (2 wheeled) Transfers: Sit to/from Stand Sit to Stand: Min assist         General transfer comment: increased time to achieve erect posture and to get both hands onto RW from EOB; min A to power up into standing   Ambulation/Gait Ambulation/Gait assistance: Min guard Ambulation Distance (Feet): 70 Feet Assistive device: Rolling walker (2 wheeled) Gait Pattern/deviations: Step-to pattern;Antalgic;Trunk flexed;Decreased stance time - left;Decreased step length - right     General Gait Details: encouraged pt to increase WB on L LE and engage L quad before WS;  vc for sequencing and position of RW; pt with improved ability to advance L LE forward but with decreased dorsiflexion   Stairs            Wheelchair Mobility    Modified Rankin (Stroke Patients Only)       Balance Overall balance assessment: Needs assistance Sitting-balance support: No upper extremity supported;Feet supported Sitting balance-Leahy Scale: Good     Standing balance support: Bilateral upper extremity supported Standing balance-Leahy Scale: Poor                      Cognition Arousal/Alertness: Awake/alert Behavior During Therapy: WFL for tasks assessed/performed Overall Cognitive Status: Within Functional Limits for tasks assessed                      Exercises Total Joint Exercises Quad Sets: AROM;Both;10 reps Heel Slides: AAROM;Left;10 reps;Seated Hip ABduction/ADduction: AAROM;Left;10 reps;Seated    General Comments        Pertinent Vitals/Pain Pain Assessment: No/denies pain Faces Pain Scale:  (with movement) Pain Location: L hip Pain Descriptors / Indicators: Sore Pain Intervention(s): Monitored during session;Premedicated before session;Ice applied    Home Living                      Prior Function            PT Goals (current goals can now be found in the care plan section) Acute Rehab PT Goals Patient Stated Goal: go home PT Goal Formulation: With patient Time For Goal Achievement: 03/27/15 Potential to Achieve Goals: Good Progress towards PT goals: Progressing toward  goals    Frequency  7X/week    PT Plan Discharge plan needs to be updated    Co-evaluation             End of Session Equipment Utilized During Treatment: Gait belt Activity Tolerance: Patient tolerated treatment well Patient left: in chair;with call bell/phone within reach;with family/visitor present     Time: 1610-9604 PT Time Calculation (min) (ACUTE ONLY): 27 min  Charges:  $Gait Training: 8-22 mins $Therapeutic  Exercise: 8-22 mins $Therapeutic Activity: 8-22 mins                    G Codes:      Derek Mound, PTA Pager: [phone]   03/21/2015, 4:06 PM

## 2015-03-22 MED ORDER — REPAGLINIDE 0.5 MG PO TABS: 0.5000 mg | ORAL_TABLET | Freq: Two times a day (BID) | ORAL | Status: AC

## 2015-03-22 MED ORDER — REPAGLINIDE 1 MG PO TABS
0.5000 mg | ORAL_TABLET | Freq: Two times a day (BID) | ORAL | Status: DC
  Administered 2015-03-22: 0.5 mg via ORAL
  Filled 2015-03-22 (×3): qty 0.5

## 2015-03-22 NOTE — Progress Notes (Signed)
Physical Therapy Treatment Patient Details Name: [name] [name] MRN: [id number] DOB: 08/05/1926 Today's Date: 03/22/2015    History of Present Illness Admitted for L THA; WBAT;  has a past medical history of Ascending aorta dilatation (HCC); Hypertension; Diabetes (HCC); Aortic stenosis; Dyslipidemia; and Pneumonia.  has past surgical history that includes Aortic valve replacement (02/13/1999); Appendectomy; Knee Arthroplasty    PT Comments    Pt with little change in mobility level this session but with decreased pain level. Continue to progress as tolerated with anticipated d/c to SNF for further skilled PT services.    Follow Up Recommendations  Supervision/Assistance - 24 hour;SNF     Equipment Recommendations  Rolling walker with 5" wheels;3in1 (PT)    Recommendations for Other Services OT consult     Precautions / Restrictions Precautions Precautions: Fall Restrictions Weight Bearing Restrictions: Yes LLE Weight Bearing: Weight bearing as tolerated    Mobility  Bed Mobility               General bed mobility comments: OOB in chair upon entering room  Transfers Overall transfer level: Needs assistance Equipment used: Rolling walker (2 wheeled) Transfers: Sit to/from Stand Sit to Stand: Min assist         General transfer comment: carry over of safe hand  placement; vc for sequencing before standing; increased time for erect posture  Ambulation/Gait Ambulation/Gait assistance: Min guard;Min assist Ambulation Distance (Feet): 50 Feet Assistive device: Rolling walker (2 wheeled) Gait Pattern/deviations: Step-to pattern;Decreased step length - right;Decreased stance time - left;Decreased dorsiflexion - left;Antalgic;Trunk flexed     General Gait Details: verbal and tactile cues for engaging L quad before WS to L LE for increased stability; pt with tendency to keep L knee flexed and with difficulty advancing L LE forward; pt with ability to dorsiflex with L but  with difficulty when stepping forward; vc for upright posture and postitioning of RW   Stairs            Wheelchair Mobility    Modified Rankin (Stroke Patients Only)       Balance Overall balance assessment: Needs assistance Sitting-balance support: Feet supported Sitting balance-Leahy Scale: Good     Standing balance support: Bilateral upper extremity supported Standing balance-Leahy Scale: Poor                      Cognition Arousal/Alertness: Awake/alert Behavior During Therapy: WFL for tasks assessed/performed Overall Cognitive Status: Within Functional Limits for tasks assessed                      Exercises Total Joint Exercises Ankle Circles/Pumps: AROM;Both;10 reps Quad Sets: AROM;Both;10 reps Heel Slides: AAROM;Left;10 reps;Seated Hip ABduction/ADduction: AAROM;Left;10 reps;Seated    General Comments General comments (skin integrity, edema, etc.): pt's son and daughter in law present for session and actively participating      Pertinent Vitals/Pain Pain Assessment: No/denies pain Pain Intervention(s): Monitored during session;Premedicated before session;Repositioned    Home Living                      Prior Function            PT Goals (current goals can now be found in the care plan section) Acute Rehab PT Goals Patient Stated Goal: go to rehab and get stronger PT Goal Formulation: With patient Time For Goal Achievement: 03/27/15 Potential to Achieve Goals: Good    Frequency  7X/week    PT Plan  Discharge plan needs to be updated    Co-evaluation             End of Session Equipment Utilized During Treatment: Gait belt Activity Tolerance: Patient tolerated treatment well Patient left: in chair;with call bell/phone within reach;with family/visitor present     Time: [id number] PT Time Calculation (min) (ACUTE ONLY): 39 min  Charges:  $Gait Training: 23-37 mins $Therapeutic Exercise: 8-22 mins                     G Codes:      Derek MoundKellyn R Toma Erichsen Reida Hem, PTA Pager: [phone]   03/22/2015, 2:58 PM

## 2015-03-22 NOTE — Clinical Social Work Note (Addendum)
Patient to be d/c'ed today [facility] [place].  Patient and family agreeable to plans will transport via ems RN to call report to [phone].  Windell MouldingEric Brock Larmon, MSW, Theresia MajorsLCSWA [phone]

## 2015-03-22 NOTE — Progress Notes (Signed)
Patient ID: [name] [name], female   DOB: 1926/04/18, 82 y.o.   MRN: [id number] Doing well overall.  SNF has been recommended by therapy and the family agrees.  Her left hip is stable.  Can be discharged today.

## 2015-03-22 NOTE — Care Management Important Message (Signed)
Important Message  Patient Details  Name: [name] [name] MRN: [id number] Date of Birth: 11-Jun-1926   Medicare Important Message Given:  Yes    Oralia RudMegan P Nalina Yeatman 03/22/2015, 3:18 PM

## 2015-03-22 NOTE — Discharge Summary (Signed)
Patient ID: [name] [name] MRN: [id number] DOB/AGE: 08/06/1926 82 y.o.  Admit date: 03/19/2015 Discharge date: 03/22/2015  Admission Diagnoses:  Principal Problem:   Osteoarthritis of left hip Active Problems:   Status post total replacement of left hip   Discharge Diagnoses:  Same  Past Medical History  Diagnosis Date  . Ascending aorta dilatation (HCC)   . Hypertension   . Diabetes (HCC)   . Aortic stenosis   . Dyslipidemia   . Pneumonia     2-3 YRS AGO     Surgeries: Procedure(s): LEFT TOTAL HIP ARTHROPLASTY ANTERIOR APPROACH on 03/19/2015   Consultants:    Discharged Condition: Improved  Hospital Course: [name] [name] is an 82 y.o. female who was admitted 03/19/2015 for operative treatment ofOsteoarthritis of left hip. Patient has severe unremitting pain that affects sleep, daily activities, and work/hobbies. After pre-op clearance the patient was taken to the operating room on 03/19/2015 and underwent  Procedure(s): LEFT TOTAL HIP ARTHROPLASTY ANTERIOR APPROACH.    Patient was given perioperative antibiotics: Anti-infectives    Start     Dose/Rate Route Frequency Ordered Stop   03/19/15 1715  ceFAZolin (ANCEF) IVPB 1 g/50 mL premix     1 g 100 mL/hr over 30 Minutes Intravenous Every 6 hours 03/19/15 1649 03/19/15 2243   03/19/15 1230  ceFAZolin (ANCEF) IVPB 2 g/50 mL premix     2 g 100 mL/hr over 30 Minutes Intravenous To ShortStay Surgical 03/18/15 1319 03/19/15 1323       Patient was given sequential compression devices, early ambulation, and chemoprophylaxis to prevent DVT.  Patient benefited maximally from hospital stay and there were no complications.    Recent vital signs: Patient Vitals for the past 24 hrs:  BP Temp Temp src Pulse Resp SpO2  03/22/15 0616 (!) 117/42 mmHg 99.3 F (37.4 C) Oral 81 17 96 %  03/21/15 2121 (!) 106/45 mmHg 99.2 F (37.3 C) Oral (!) 104 19 96 %  03/21/15 1414 (!) 105/34 mmHg 98.1 F (36.7 C) Oral 82 18 97 %     Recent  laboratory studies: No results for input(s): WBC, HGB, HCT, PLT, NA, K, CL, CO2, BUN, CREATININE, GLUCOSE, INR, CALCIUM in the last 72 hours.  Invalid input(s): PT, 2   Discharge Medications:     Medication List    STOP taking these medications        aspirin 81 MG tablet  Replaced by:  aspirin 325 MG EC tablet      TAKE these medications        aspirin 325 MG EC tablet  Take 1 tablet (325 mg total) by mouth daily with breakfast.     docusate sodium 100 MG capsule  Commonly known as:  COLACE  Take 100 mg by mouth 2 (two) times daily.     donepezil 10 MG tablet  Commonly known as:  ARICEPT  Take 10 mg by mouth at bedtime.     fluticasone 50 MCG/ACT nasal spray  Commonly known as:  FLONASE  Place 2 sprays into the nose daily as needed for allergies.     folic acid 400 MCG tablet  Commonly known as:  FOLVITE  Take 400 mcg by mouth daily.     HYDROcodone-acetaminophen 5-325 MG tablet  Commonly known as:  NORCO  Take 1-2 tablets by mouth every 4 (four) hours as needed for moderate pain.     losartan-hydrochlorothiazide 100-25 MG tablet  Commonly known as:  HYZAAR  Take 1 tablet by mouth  daily.     methocarbamol 500 MG tablet  Commonly known as:  ROBAXIN  Take 1 tablet (500 mg total) by mouth every 6 (six) hours as needed for muscle spasms.     multivitamin per tablet  Take 1 tablet by mouth daily.     nortriptyline 10 MG capsule  Commonly known as:  PAMELOR  Take 10 mg by mouth at bedtime.     repaglinide 0.5 MG tablet  Commonly known as:  PRANDIN  Take 0.5 mg by mouth 2 (two) times daily before a meal.        Diagnostic Studies: Dg Hip Port Unilat With Pelvis 1v Left  03/19/2015  CLINICAL DATA:  Left hip arthroplasty EXAM: DG HIP (WITH OR WITHOUT PELVIS) 1V PORT LEFT COMPARISON:  Fluoroscopy from earlier today FINDINGS: Total left hip arthroplasty without periprosthetic fracture or dislocation. Expected soft tissue gas. IMPRESSION: No adverse finding after  left hip arthroplasty. Electronically Signed   By: Marnee SpringJonathon  Watts M.D.   On: 03/19/2015 15:49   Dg Hip Operative Unilat With Pelvis Left  03/19/2015  CLINICAL DATA:  Left total hip arthroplasty anterior approach EXAM: OPERATIVE LEFT HIP (WITH PELVIS IF PERFORMED)  VIEWS TECHNIQUE: Fluoroscopic spot image(s) were submitted for interpretation post-operatively. COMPARISON:  None. FINDINGS: Fluoroscopy Time:  0 minutes 21 seconds Number of Acquired Images:  3 Total left hip replacement with components in anticipated position. It is noted that superior acetabular screw extends across the medial cortex of the acetabulum and projects over the pelvis. IMPRESSION: Total hip replacement as described Electronically Signed   By: Esperanza Heiraymond  Rubner M.D.   On: 03/19/2015 14:53    Disposition: to skilled nursing facility      Discharge Instructions    Discharge patient    Complete by:  As directed            Follow-up Information    Follow up with Kathryne HitchBLACKMAN,Pheonix Wisby Y, MD In 2 weeks.   Specialty:  Orthopedic Surgery   Contact information:   [address] [place] [address] [phone]       Follow up with [facility] [place] .   Specialty:  Skilled Nursing Facility   Contact information:   [address] [place] [id number] [phone]      Follow up with Kathryne HitchBLACKMAN,Tyjae Issa Y, MD In 2 weeks.   Specialty:  Orthopedic Surgery   Contact information:   [address] [place] [address] [phone]        Signed: Kathryne HitchBLACKMAN,Aymee Fomby Y 03/22/2015, 6:49 AM

## 2015-03-22 NOTE — Progress Notes (Signed)
Patient is under pain control with pain meds mangament and IV access D/C without comlication. [facility]. Hand off report gave to RN, Briant CedarMelanie, [facility].

## 2015-05-14 IMAGING — CT CT ANGIO CHEST
3 of 6 series · 19 of 36 positions shown · IV contrast (80CC OMNI 350)
Comparison: CT scan of March 30, 2012.

CLINICAL DATA: Thoracic aortic aneurysm.

EXAM:
CT ANGIOGRAPHY CHEST WITH CONTRAST
TECHNIQUE: Multidetector CT imaging of the chest was performed using the
standard protocol during bolus administration of intravenous
contrast. Multiplanar CT image reconstructions and MIPs were
obtained to evaluate the vascular anatomy.
CONTRAST:  80mL OMNIPAQUE IOHEXOL 350 MG/ML SOLN

[Series 4: cta prestent taa · axial · 0.59mm/px · z∈[-250,-20]mm · 9 of 116 slices shown]
[im 12/116  lung]
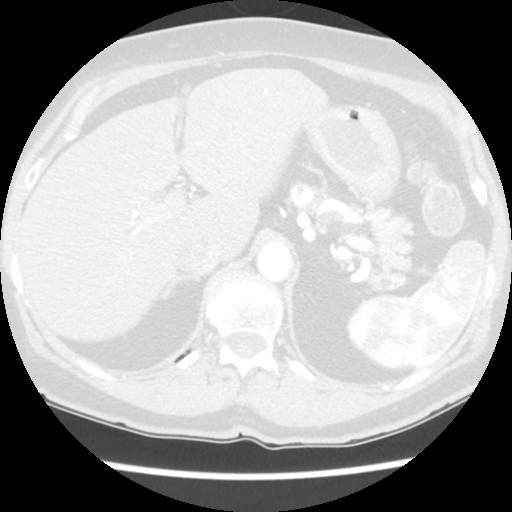
[im 24/116  mediastinal]
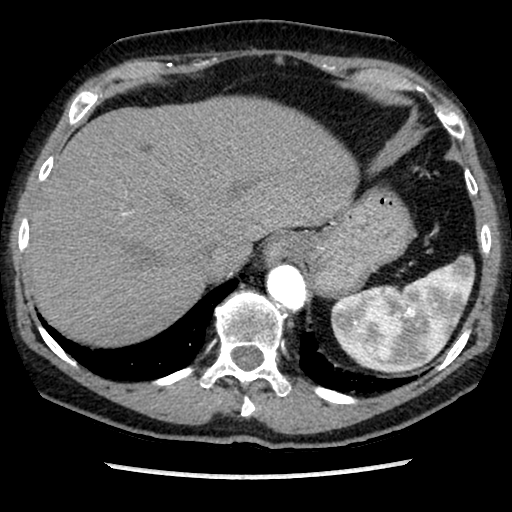
[im 35/116  lung]
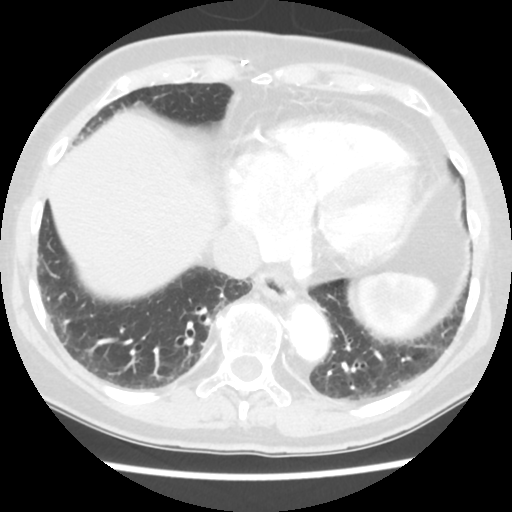
[im 47/116  mediastinal]
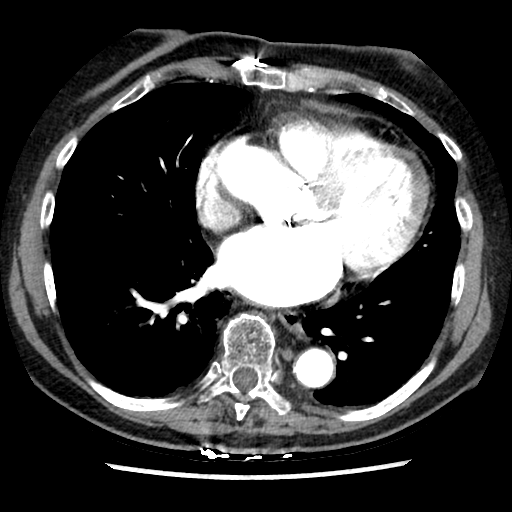
[im 58/116  lung]
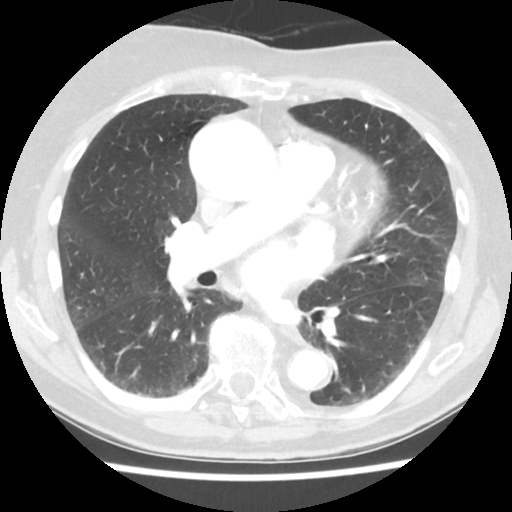
[im 70/116  mediastinal]
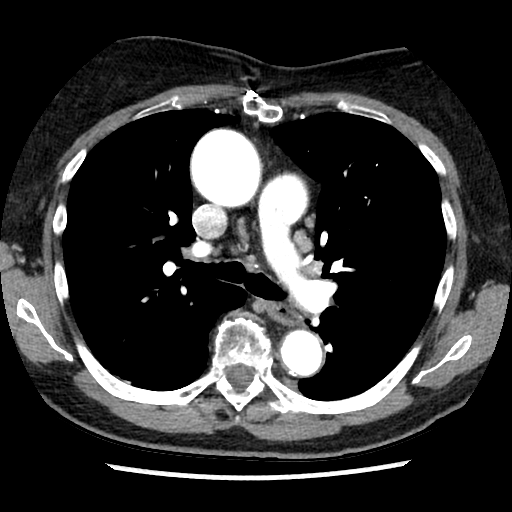
[im 81/116  lung]
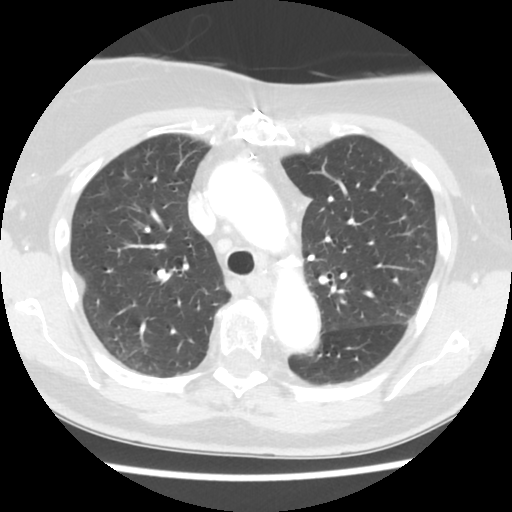
[im 93/116  mediastinal]
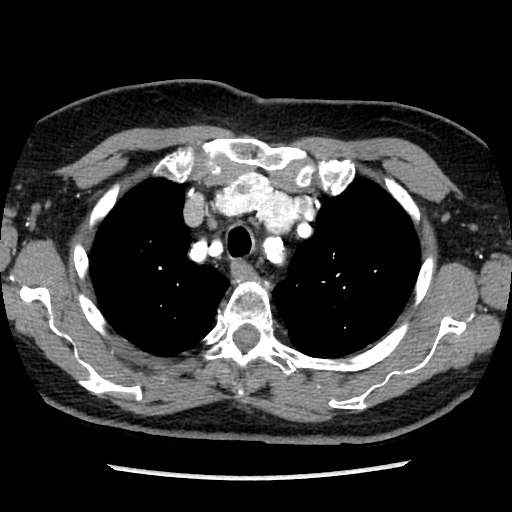
[im 104/116  lung]
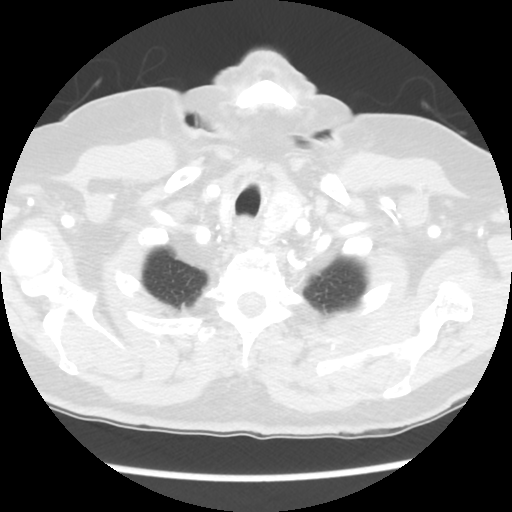

[Series 5: lung windows · axial · 0.59mm/px · z∈[-205,-135]mm · 2 of 58 slices shown]
[im 15/58  mediastinal]
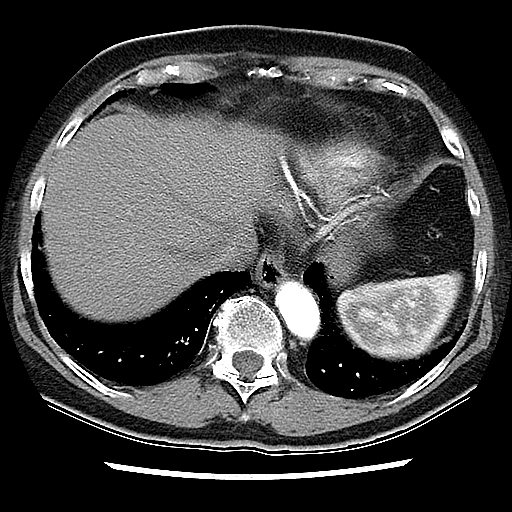
[im 29/58  mediastinal]
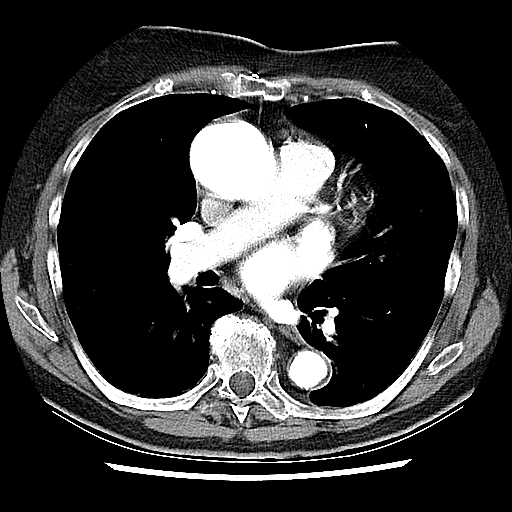

[Series 602: sagittal body · sagittal · 0.59mm/px · 8 of 123 slices shown]
[im 12/123  lung]
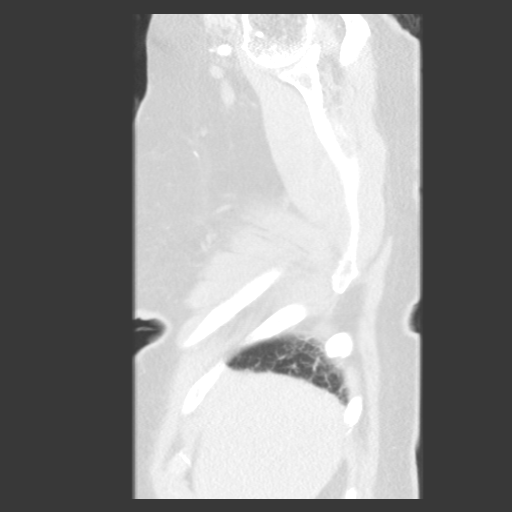
[im 23/123  lung]
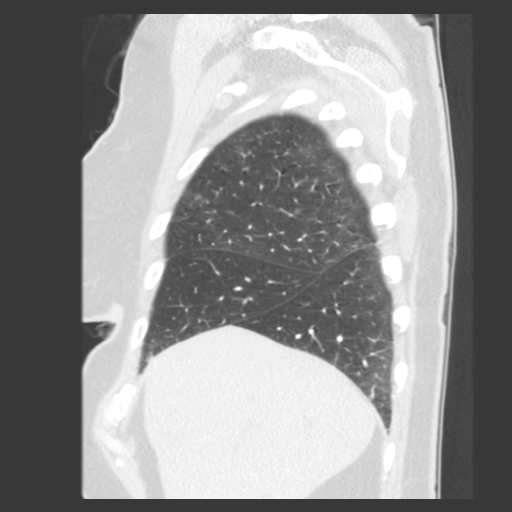
[im 45/123  lung]
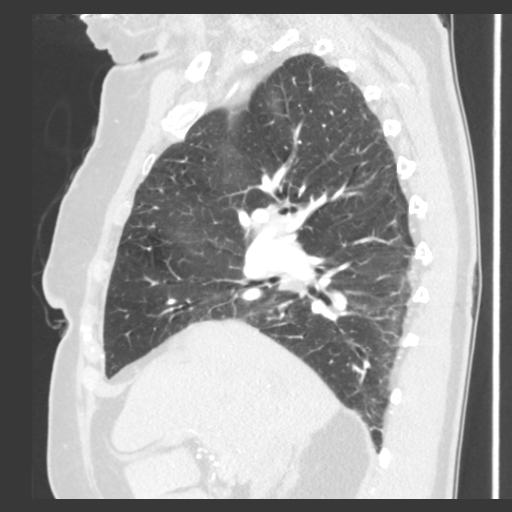
[im 56/123  lung]
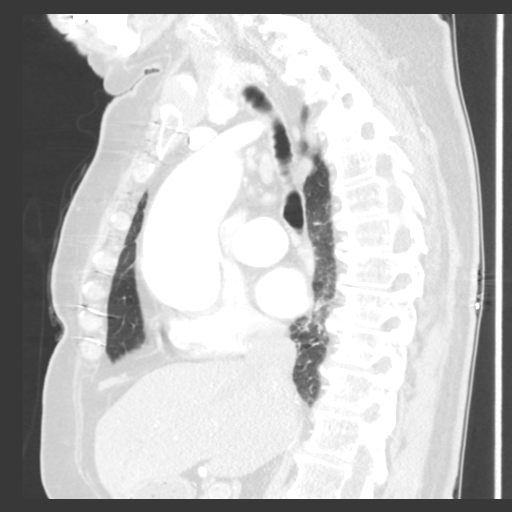
[im 67/123  lung]
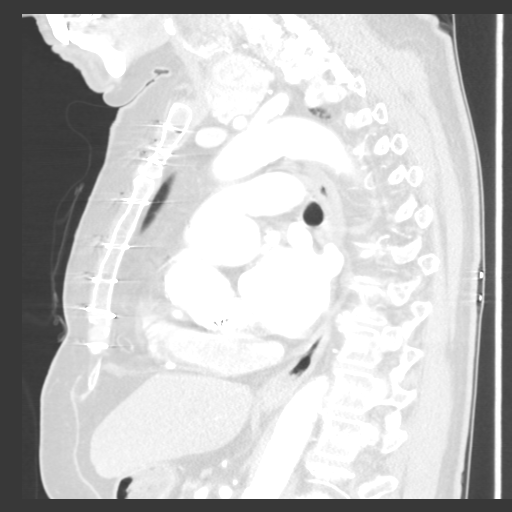
[im 78/123  lung]
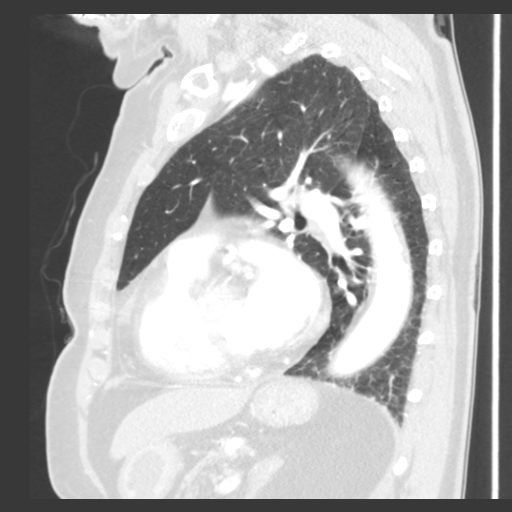
[im 100/123  lung]
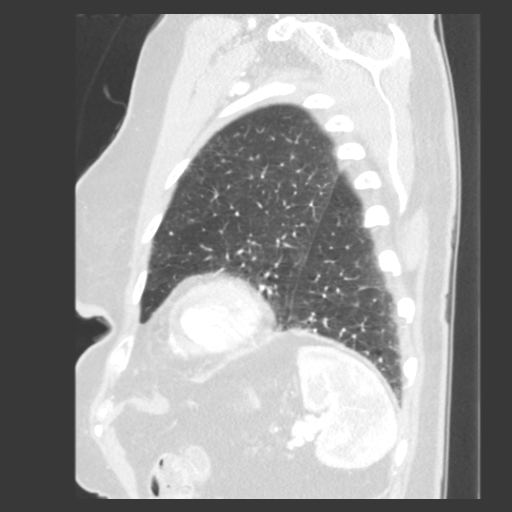
[im 111/123  lung]
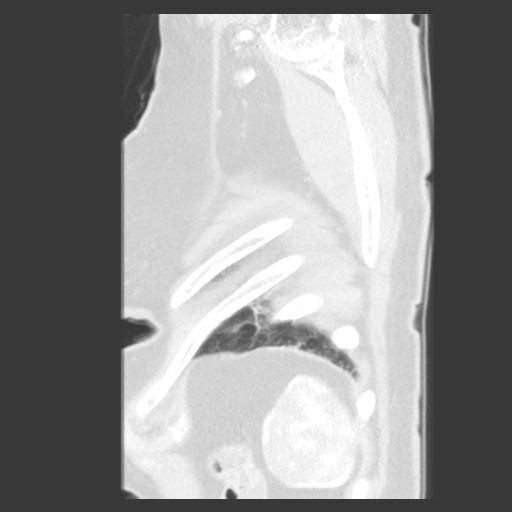

[19 of 36 positions shown; findings below may reference images not displayed]

FINDINGS: No pneumothorax or pleural effusion is noted. No significant
pulmonary parenchymal abnormality is noted. Stable enlargement of
left thyroid lobe is again noted. Mildly enlarged mediastinal lymph
nodes are noted which are stable compared to prior exam and not felt
to be significant based on size criteria. Status post surgical
aortic valve replacement. Ascending thoracic aortic aneurysm is
again noted which measures 4.5 x 4.5 cm and is therefore not
significantly changed compared to prior exam. There is no evidence
of dissection. Minimal calcifications are seen involving the
coronary arteries. Stable low density is noted left hepatic lobe.

Review of the MIP images confirms the above findings.
IMPRESSION: Stable ascending thoracic aortic aneurysm is noted. Status post
aortic valve replacement. Stable enlargement of left thyroid lobe is
noted.

## 2016-03-18 ENCOUNTER — Ambulatory Visit (INDEPENDENT_AMBULATORY_CARE_PROVIDER_SITE_OTHER): Payer: Self-pay | Admitting: Orthopaedic Surgery

## 2016-03-24 ENCOUNTER — Ambulatory Visit (INDEPENDENT_AMBULATORY_CARE_PROVIDER_SITE_OTHER): Payer: Medicare Other

## 2016-03-24 ENCOUNTER — Ambulatory Visit (INDEPENDENT_AMBULATORY_CARE_PROVIDER_SITE_OTHER): Payer: Medicare Other | Admitting: Orthopaedic Surgery

## 2016-03-24 DIAGNOSIS — Z96642 Presence of left artificial hip joint: Secondary | ICD-10-CM

## 2016-03-24 DIAGNOSIS — M25552 Pain in left hip: Secondary | ICD-10-CM

## 2016-03-24 NOTE — Progress Notes (Signed)
The patient is a 83 year old female patient of mine who is 1 year out from a left total hip arthroplasty direct anterior approach. She ambulates with a cane. She has no problems or pain with that hip at all. She does point down to the side of her knee as source of pain she denies any groin pain. She denies any pain in her right hip.  On exam of the left hip she has full range of motion actively and passively with no pain around the hip at all. Her left hip has good strength. Her right hip exam is normal. Shows have some pain distally over the IT band but this is minimal.  An AP pelvis and a lateral of her left operative hip shows a well-seated implant with no, getting features. Is good alignment overall in no evidence of loosening. There is no acute bony findings. He  At this point she is doing well. She'll follow-up as needed. I cannot see her back for anything we told her about things need to bring her back for her left hip. There is any issues her son will let us know.

## 2016-10-05 IMAGING — RF DG HIP (WITH PELVIS) OPERATIVE*L*
1 series · 3 of 3 positions shown · non-contrast
Comparison: None.

CLINICAL DATA: Left total hip arthroplasty anterior approach

EXAM:
OPERATIVE LEFT HIP (WITH PELVIS IF PERFORMED)  VIEWS
TECHNIQUE: Fluoroscopic spot image(s) were submitted for interpretation
post-operatively.

[Series 1: run · 3 of 3 slices shown]
[im 1/3]
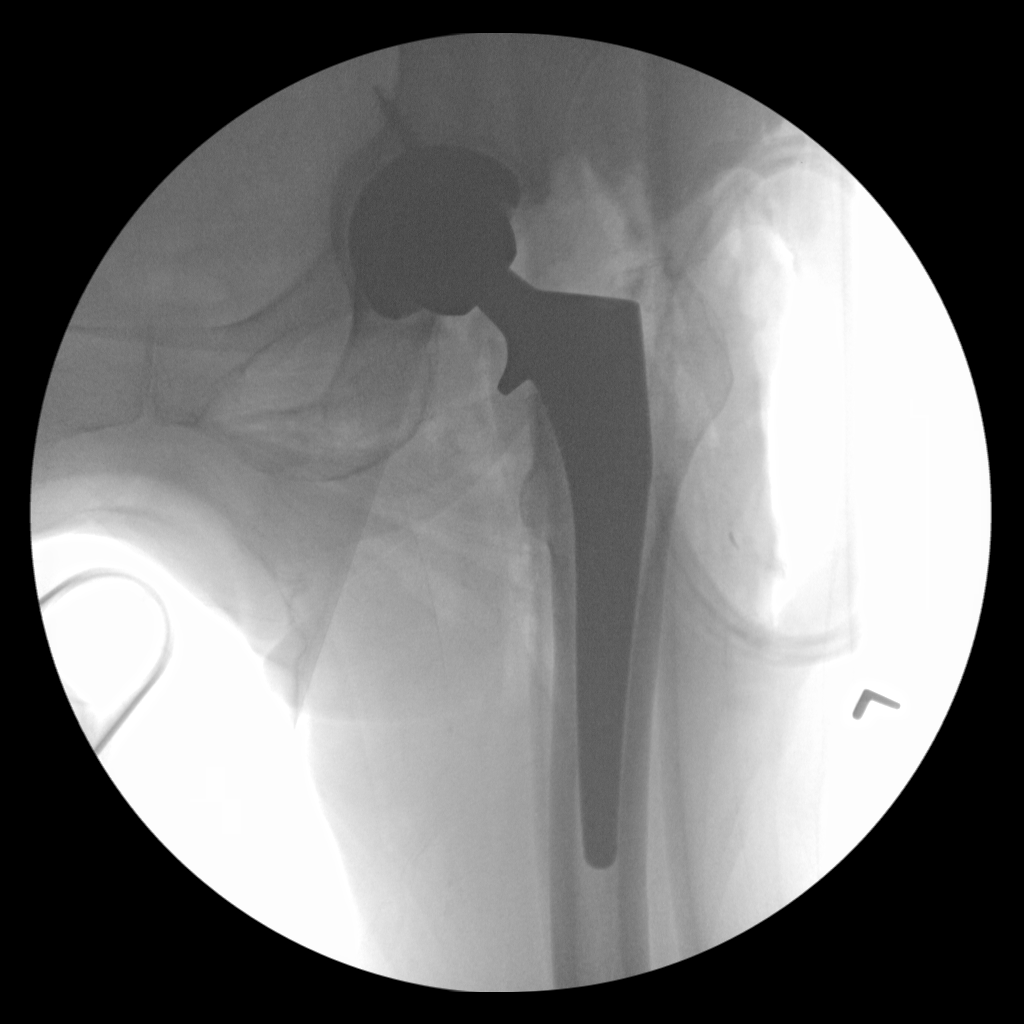
[im 2/3]
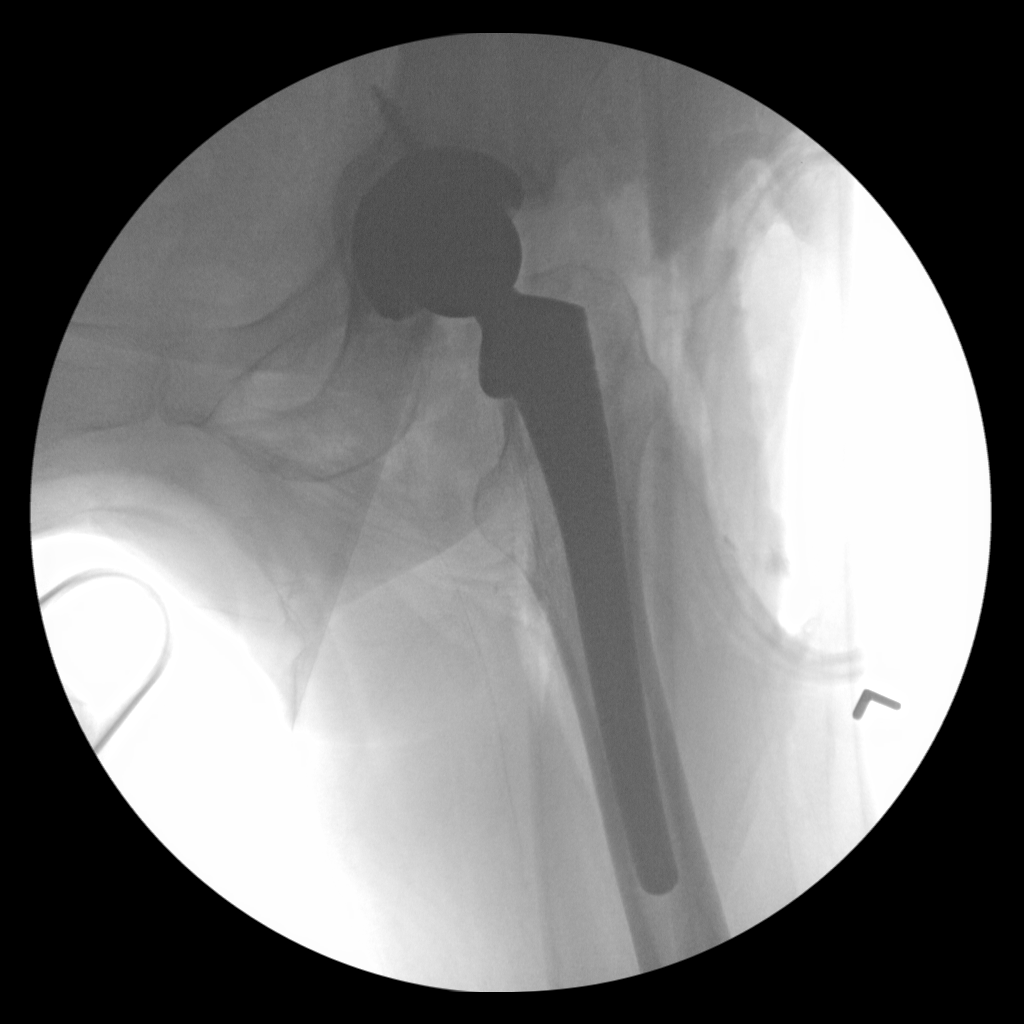
[im 3/3]
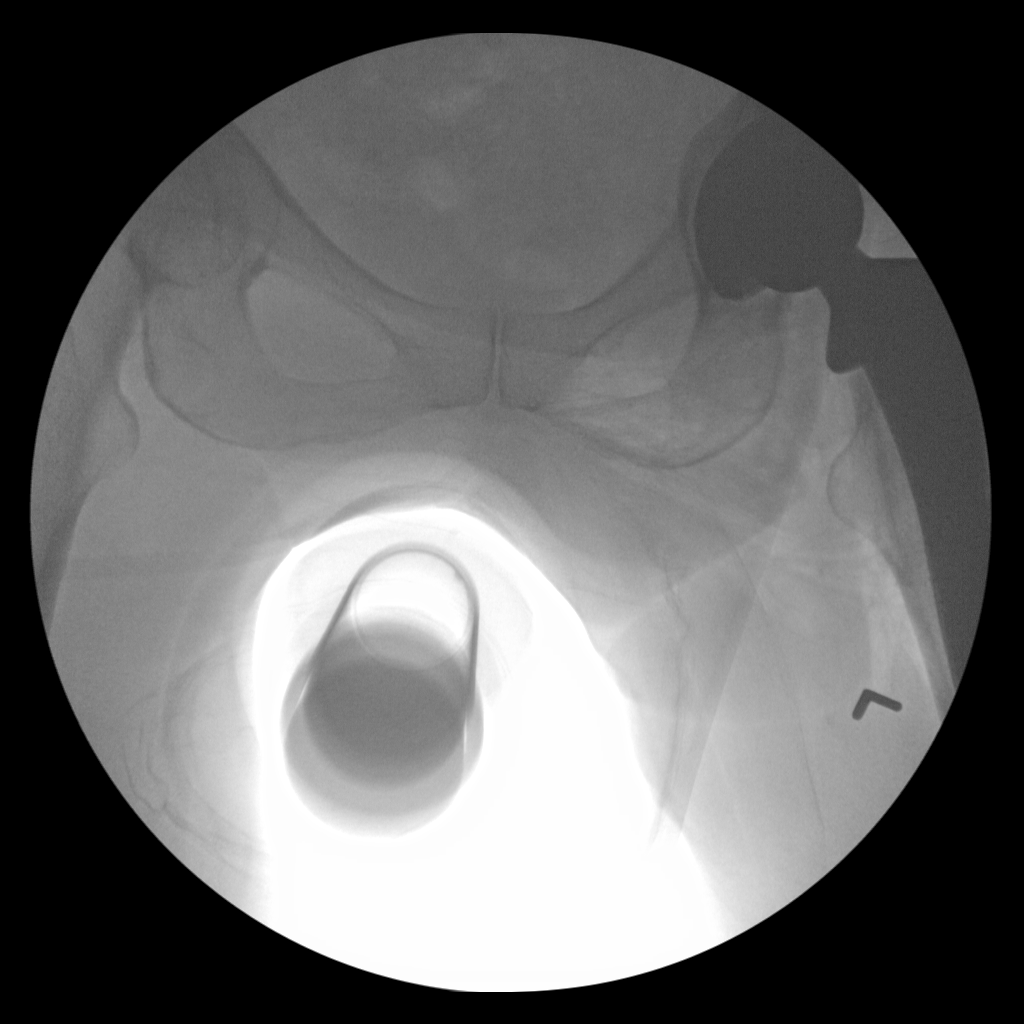

[3 of 3 positions shown; findings below may reference images not displayed]

FINDINGS: Fluoroscopy Time:  0 minutes 21 seconds

Number of Acquired Images:  3

Total left hip replacement with components in anticipated position.
It is noted that superior acetabular screw extends across the medial
cortex of the acetabulum and projects over the pelvis.
IMPRESSION: Total hip replacement as described

## 2016-10-05 IMAGING — CR DG HIP (WITH OR WITHOUT PELVIS) 1V PORT*L*
1 series · 1 of 1 positions shown · non-contrast
Comparison: Fluoroscopy from earlier today

CLINICAL DATA: Left hip arthroplasty

EXAM:
DG HIP (WITH OR WITHOUT PELVIS) 1V PORT LEFT

[AP]
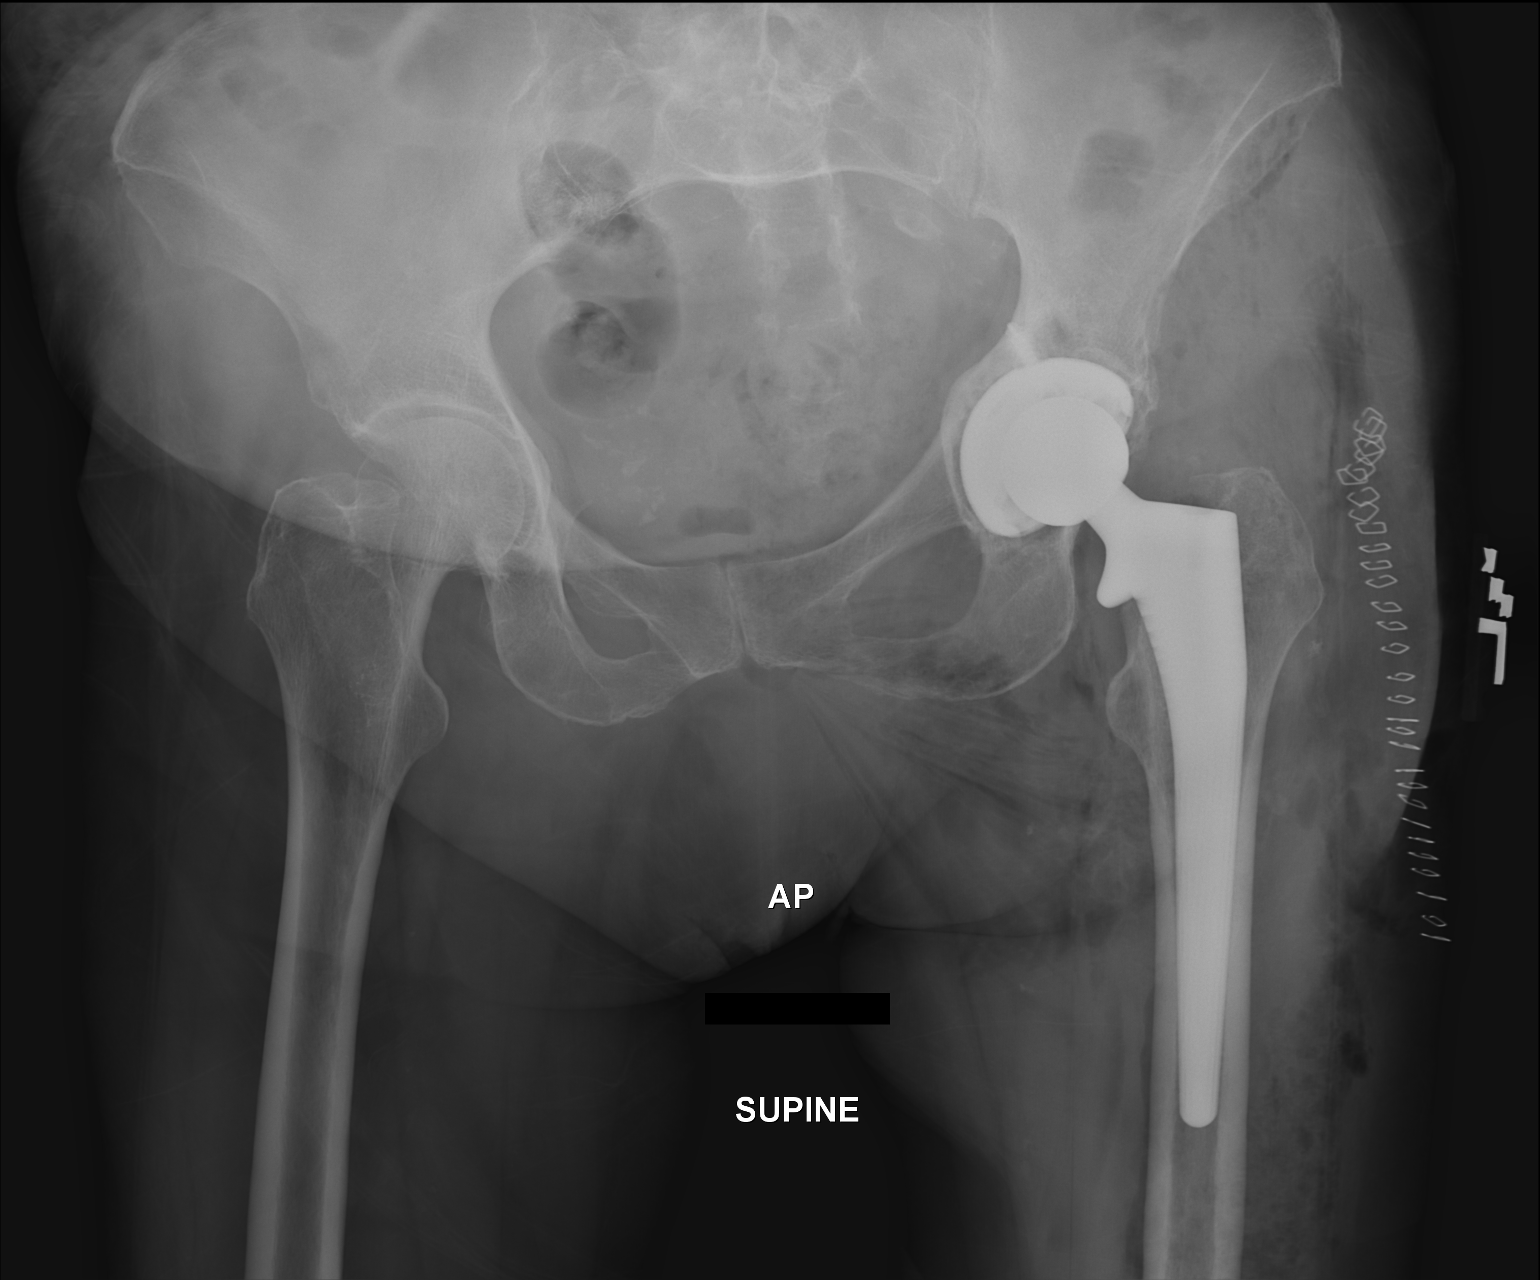

[1 of 1 positions shown; findings below may reference images not displayed]

FINDINGS: Total left hip arthroplasty without periprosthetic fracture or
dislocation. Expected soft tissue gas.
IMPRESSION: No adverse finding after left hip arthroplasty.

## 2019-10-08 DEATH — deceased
